# Patient Record
Sex: Female | Born: 1995 | Race: White | Hispanic: No | Smoking: Never smoker
Health system: Southern US, Community
[De-identification: ages and names within clinical notes are randomized; demographics above are authoritative.]

## PROBLEM LIST (undated history)

## (undated) ENCOUNTER — Inpatient Hospital Stay: Payer: Self-pay

## (undated) DIAGNOSIS — O139 Gestational [pregnancy-induced] hypertension without significant proteinuria, unspecified trimester: Secondary | ICD-10-CM

## (undated) DIAGNOSIS — Z789 Other specified health status: Secondary | ICD-10-CM

## (undated) HISTORY — DX: Gestational (pregnancy-induced) hypertension without significant proteinuria, unspecified trimester: O13.9

---
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
## 2017-01-05 ENCOUNTER — Other Ambulatory Visit (HOSPITAL_COMMUNITY): Payer: Self-pay | Admitting: Family Medicine

## 2017-01-05 DIAGNOSIS — Z3402 Encounter for supervision of normal first pregnancy, second trimester: Secondary | ICD-10-CM

## 2017-01-06 LAB — OB RESULTS CONSOLE HEPATITIS B SURFACE ANTIGEN: HEP B S AG: NEGATIVE

## 2017-01-08 ENCOUNTER — Ambulatory Visit
Admission: RE | Admit: 2017-01-08 | Discharge: 2017-01-08 | Disposition: A | Discharge status: Home / Self Care | Payer: Medicaid Other | Source: Ambulatory Visit | Attending: Family Medicine | Admitting: Family Medicine

## 2017-01-08 DIAGNOSIS — Z3402 Encounter for supervision of normal first pregnancy, second trimester: Secondary | ICD-10-CM | POA: Insufficient documentation

## 2017-01-08 DIAGNOSIS — Z3A18 18 weeks gestation of pregnancy: Secondary | ICD-10-CM | POA: Insufficient documentation

## 2017-01-16 ENCOUNTER — Other Ambulatory Visit: Payer: Self-pay

## 2017-01-16 ENCOUNTER — Emergency Department
Admission: EM | Admit: 2017-01-16 | Discharge: 2017-01-16 | Disposition: A | Discharge status: Home / Self Care | Payer: BLUE CROSS/BLUE SHIELD | Attending: Emergency Medicine | Admitting: Emergency Medicine

## 2017-01-16 ENCOUNTER — Encounter: Payer: Self-pay | Admitting: Emergency Medicine

## 2017-01-16 DIAGNOSIS — M79604 Pain in right leg: Secondary | ICD-10-CM | POA: Insufficient documentation

## 2017-01-16 DIAGNOSIS — R600 Localized edema: Secondary | ICD-10-CM | POA: Insufficient documentation

## 2017-01-16 DIAGNOSIS — O26892 Other specified pregnancy related conditions, second trimester: Secondary | ICD-10-CM | POA: Insufficient documentation

## 2017-01-16 DIAGNOSIS — Z3A21 21 weeks gestation of pregnancy: Secondary | ICD-10-CM | POA: Insufficient documentation

## 2017-01-16 DIAGNOSIS — L03115 Cellulitis of right lower limb: Secondary | ICD-10-CM | POA: Diagnosis not present

## 2017-01-16 MED ORDER — CEPHALEXIN 500 MG PO CAPS: 500.0000 mg | ORAL_CAPSULE | Freq: Four times a day (QID) | ORAL | 0 refills | Status: AC

## 2017-01-16 MED ORDER — CEPHALEXIN 500 MG PO CAPS
500.0000 mg | ORAL_CAPSULE | Freq: Once | ORAL | Status: AC
  Administered 2017-01-16: 500 mg via ORAL
  Filled 2017-01-16: qty 1

## 2017-01-16 NOTE — ED Triage Notes (Signed)
Surface pain R anterior shin x 1 month. Tender to touch.

## 2017-01-16 NOTE — ED Provider Notes (Signed)
[facility] Emergency Department Provider Note   ____________________________________________   First MD Initiated Contact with Patient 01/16/17 1748     (approximate)  I have reviewed the triage vital signs and the nursing notes.   HISTORY  Chief Complaint Leg Pain    HPI Jenna Marks is a 21 y.o. female patient complaining of redness and pain to the right anterior shin for 1 month. Patient denies history of trauma. Patient stated she noticed a pimple couple weeks ago and now has progressed to redness to the right leg.Patient rates pain as a 4/10. Patient described a pain as "achy". Patient denies fever but this complaint. No palliative measures for complaint. Patient is 21 weeks' gestation.   History reviewed. No pertinent past medical history.  There are no active problems to display for this patient.   History reviewed. No pertinent surgical history.  Prior to Admission medications   Medication Sig Start Date End Date Taking? Authorizing Provider  cephALEXin (KEFLEX) 500 MG capsule Take 1 capsule (500 mg total) 4 (four) times daily for 10 days by mouth. 01/16/17 01/26/17  Joni ReiningSmith, Laelynn Blizzard K, PA-C    Allergies Patient has no known allergies.  No family history on file.  Social History Social History   Tobacco Use  . Smoking status: Never Smoker  . Smokeless tobacco: Never Used  Substance Use Topics  . Alcohol use: No  . Drug use: No    Review of Systems Constitutional: No fever/chills Eyes: No visual changes. ENT: No sore throat. Cardiovascular: Denies chest pain. Respiratory: Denies shortness of breath. Gastrointestinal: No abdominal pain.  No nausea, no vomiting.  No diarrhea.  No constipation. Genitourinary: Negative for dysuria. Musculoskeletal: Negative for back pain. Skin: Negative for rash. Neurological: Negative for headaches, focal weakness or numbness.   ____________________________________________   PHYSICAL  EXAM:  VITAL SIGNS: ED Triage Vitals  Enc Vitals Group     BP 01/16/17 1707 128/75     Pulse Rate 01/16/17 1707 82     Resp 01/16/17 1707 20     Temp 01/16/17 1707 98.1 F (36.7 C)     Temp src --      SpO2 01/16/17 1707 100 %     Weight 01/16/17 1709 130 lb (59 kg)     Height 01/16/17 1709 5\' 3"  (1.6 m)     Head Circumference --      Peak Flow --      Pain Score 01/16/17 1707 4     Pain Loc --      Pain Edu? --      Excl. in GC? --     Constitutional: Alert and oriented. Well appearing and in no acute distress. Eyes: Conjunctivae are normal. PERRL. EOMI. Head: Atraumatic. Nose: No congestion/rhinnorhea. Mouth/Throat: Mucous membranes are moist.  Oropharynx non-erythematous. Neck: No stridor. Hematological/Lymphatic/Immunilogical: No cervical lymphadenopathy. Cardiovascular: Normal rate, regular rhythm. Grossly normal heart sounds.  Good peripheral circulation. Respiratory: Normal respiratory effort.  No retractions. Lungs CTAB. Gastrointestinal: Soft and nontender. No distention. No abdominal bruits. No CVA tenderness. Musculoskeletal: No lower extremity tenderness nor edema.  No joint effusions. Neurologic:  Normal speech and language. No gross focal neurologic deficits are appreciated. No gait instability. Skin:  Erythema and edema distal third right anterior leg Psychiatric: Mood and affect are normal. Speech and behavior are normal.  ____________________________________________   LABS (all labs ordered are listed, but only abnormal results are displayed)  Labs Reviewed - No data to display ____________________________________________  EKG  ____________________________________________  RADIOLOGY  No results found.  ____________________________________________   PROCEDURES  Procedure(s) performed: None  Procedures  Critical Care performed: No  ____________________________________________   INITIAL IMPRESSION / ASSESSMENT AND PLAN / ED COURSE  As  part of my medical decision making, I reviewed the following data within the electronic MEDICAL RECORD NUMBER   Cellulitis right lower leg. Patient given discharge care instructions. Patient advised at the same area for one month. Patient advised to take medication as directed. Follow with PCP.      ____________________________________________   FINAL CLINICAL IMPRESSION(S) / ED DIAGNOSES  Final diagnoses:  Cellulitis of right leg without foot     ED Discharge Orders        Ordered    cephALEXin (KEFLEX) 500 MG capsule  4 times daily     01/16/17 1803       Note:  This document was prepared using Dragon voice recognition software and may include unintentional dictation errors.    Joni ReiningSmith, Lorianna Spadaccini K, PA-C 01/16/17 1805    Jeanmarie PlantMcShane, James A, MD 01/16/17 2101

## 2017-01-16 NOTE — Discharge Instructions (Signed)
Take medication as directed and do not shave area for 2-3 weeks. Take Tylenol or ibuprofen for pain.

## 2017-01-22 ENCOUNTER — Observation Stay
Admission: EM | Admit: 2017-01-22 | Discharge: 2017-01-22 | Disposition: A | Discharge status: Home / Self Care | Payer: BLUE CROSS/BLUE SHIELD | Attending: Obstetrics and Gynecology | Admitting: Obstetrics and Gynecology

## 2017-01-22 ENCOUNTER — Encounter: Payer: Self-pay | Admitting: *Deleted

## 2017-01-22 DIAGNOSIS — R102 Pelvic and perineal pain: Secondary | ICD-10-CM | POA: Diagnosis not present

## 2017-01-22 DIAGNOSIS — O26892 Other specified pregnancy related conditions, second trimester: Secondary | ICD-10-CM | POA: Diagnosis not present

## 2017-01-22 DIAGNOSIS — Z3A25 25 weeks gestation of pregnancy: Secondary | ICD-10-CM

## 2017-01-22 DIAGNOSIS — Z79899 Other long term (current) drug therapy: Secondary | ICD-10-CM | POA: Insufficient documentation

## 2017-01-22 DIAGNOSIS — R109 Unspecified abdominal pain: Secondary | ICD-10-CM | POA: Insufficient documentation

## 2017-01-22 DIAGNOSIS — M549 Dorsalgia, unspecified: Secondary | ICD-10-CM | POA: Diagnosis present

## 2017-01-22 HISTORY — DX: Other specified health status: Z78.9

## 2017-01-22 LAB — URINALYSIS, COMPLETE (UACMP) WITH MICROSCOPIC
Bilirubin Urine: NEGATIVE
Glucose, UA: NEGATIVE mg/dL
Ketones, ur: 20 mg/dL — AB
Leukocytes, UA: NEGATIVE
Nitrite: NEGATIVE
Protein, ur: NEGATIVE mg/dL
Specific Gravity, Urine: 1.02 (ref 1.005–1.030)
pH: 5 (ref 5.0–8.0)

## 2017-01-22 LAB — CBC
HEMATOCRIT: 32.3 % — AB (ref 35.0–47.0)
HEMOGLOBIN: 11.2 g/dL — AB (ref 12.0–16.0)
MCH: 30.3 pg (ref 26.0–34.0)
MCHC: 34.6 g/dL (ref 32.0–36.0)
MCV: 87.7 fL (ref 80.0–100.0)
Platelets: 296 10*3/uL (ref 150–440)
RBC: 3.69 MIL/uL — ABNORMAL LOW (ref 3.80–5.20)
RDW: 14.2 % (ref 11.5–14.5)
WBC: 17 10*3/uL — ABNORMAL HIGH (ref 3.6–11.0)

## 2017-01-22 MED ORDER — OXYCODONE-ACETAMINOPHEN 5-325 MG PO TABS: 2.0000 | ORAL_TABLET | ORAL | Status: DC | PRN

## 2017-01-22 MED ORDER — OXYCODONE-ACETAMINOPHEN 5-325 MG PO TABS
ORAL_TABLET | ORAL | Status: AC
  Administered 2017-01-22: 2 via ORAL
  Filled 2017-01-22: qty 2

## 2017-01-22 MED ORDER — LACTATED RINGERS IV SOLN
INTRAVENOUS | Status: DC
  Administered 2017-01-22: 09:00:00 via INTRAVENOUS

## 2017-01-22 MED ORDER — OXYCODONE-ACETAMINOPHEN 5-325 MG PO TABS: 1.0000 | ORAL_TABLET | ORAL | 0 refills | Status: DC | PRN

## 2017-01-22 MED ORDER — OXYCODONE-ACETAMINOPHEN 5-325 MG PO TABS
2.0000 | ORAL_TABLET | Freq: Once | ORAL | Status: AC
  Administered 2017-01-22: 2 via ORAL

## 2017-01-22 NOTE — Discharge Summary (Addendum)
    L&D OB Triage Note  SUBJECTIVE Jenna Marks is a 21 y.o. G1P0 female at 6063w4d, EDD Estimated Date of Delivery: 05/03/17 who presented to triage with complaints of right flank pain.  She has never had pain like this before.  Denies midline pain, denies vaginal bleeding, has had some nausea and vomiting but has had the same amount throughout the rest of her pregnancy.  This is not new for her.  Tried Tylenol without success.  Obstetric History   G1   P0   T0   P0   A0   L0    SAB0   TAB0   Ectopic0   Multiple0   Live Births0     # Outcome Date GA Lbr Len/2nd Weight Sex Delivery Anes PTL Lv  1 Current               Medications Prior to Admission  Medication Sig Dispense Refill Last Dose  . Prenatal Vit-Fe Fumarate-FA (MULTIVITAMIN-PRENATAL) 27-0.8 MG TABS tablet Take 1 tablet by mouth daily at 12 noon.     . cephALEXin (KEFLEX) 500 MG capsule Take 1 capsule (500 mg total) 4 (four) times daily for 10 days by mouth. 40 capsule 0    I saw this patient and spent time with her discussing her condition and follow-up.   OBJECTIVE  Nursing Evaluation:   BP 107/63   Pulse 90   Temp 97.8 F (36.6 C) (Oral)   Resp 18    Findings: Fetal heart tones appropriate for gestational age.    UA with large blood.  NST was performed and has been reviewed by me.  NST INTERPRETATION: Fetal heart tones appropriate for gestational age  Mode: Doppler Baseline Rate (A): 138 bpm           Contraction Frequency (min): none  ASSESSMENT Impression:  1.  Pregnancy:  G1P0 at 363w4d , EDD Estimated Date of Delivery: 05/03/17 2.  NST: Appropriate for gestational age 733.  Pain has resolved with fluid hydration and p.o. pain medication.  PLAN 1. Reassurance given 2. Discharge home with standard labor precautions given to return to L&D or call the office for problems. 3.  Percocet given for pain relief 4.  Discussed fluid hydration 5.  Patient to strain urine.

## 2017-01-22 NOTE — OB Triage Note (Signed)
Patient states she has been having right sided back pain for several days that has gotten worse last night rating 10/10 aching and stabbing. Denies LOF, Vaginal bleeding or any other concerns. Denies cramping or contractions and pt has not ever felt baby move yet (pt says Dr. Catalina Pizzaold her it could be due to anterior placenta) RN unable to monitor FHT with US, FHT 145 with doppler.

## 2017-01-22 NOTE — Discharge Summary (Signed)
Patient discharged home, discharge instructions given, patient states understanding. Patient left floor in stable condition, denies any other needs at this time. Patient to keep next scheduled OB appointment 

## 2017-01-22 NOTE — Discharge Instructions (Signed)
Drink plenty of fluid and get plenty of rest. Call your provider for any other concerns. Keep your next scheduled follow up appointment

## 2017-02-06 ENCOUNTER — Emergency Department
Admission: EM | Admit: 2017-02-06 | Discharge: 2017-02-06 | Disposition: A | Discharge status: Home / Self Care | Payer: BLUE CROSS/BLUE SHIELD | Attending: Emergency Medicine | Admitting: Emergency Medicine

## 2017-02-06 ENCOUNTER — Emergency Department: Payer: BLUE CROSS/BLUE SHIELD

## 2017-02-06 ENCOUNTER — Encounter: Payer: Self-pay | Admitting: Emergency Medicine

## 2017-02-06 ENCOUNTER — Other Ambulatory Visit: Payer: Self-pay

## 2017-02-06 DIAGNOSIS — O9989 Other specified diseases and conditions complicating pregnancy, childbirth and the puerperium: Secondary | ICD-10-CM | POA: Diagnosis not present

## 2017-02-06 DIAGNOSIS — L03115 Cellulitis of right lower limb: Secondary | ICD-10-CM | POA: Diagnosis not present

## 2017-02-06 DIAGNOSIS — Z79899 Other long term (current) drug therapy: Secondary | ICD-10-CM | POA: Insufficient documentation

## 2017-02-06 DIAGNOSIS — R239 Unspecified skin changes: Secondary | ICD-10-CM | POA: Diagnosis present

## 2017-02-06 DIAGNOSIS — Z3A22 22 weeks gestation of pregnancy: Secondary | ICD-10-CM | POA: Diagnosis not present

## 2017-02-06 DIAGNOSIS — Z3402 Encounter for supervision of normal first pregnancy, second trimester: Secondary | ICD-10-CM | POA: Insufficient documentation

## 2017-02-06 LAB — COMPREHENSIVE METABOLIC PANEL
ALT: 15 U/L (ref 14–54)
AST: 17 U/L (ref 15–41)
Albumin: 3.5 g/dL (ref 3.5–5.0)
Alkaline Phosphatase: 85 U/L (ref 38–126)
Anion gap: 10 (ref 5–15)
BUN: 8 mg/dL (ref 6–20)
CO2: 22 mmol/L (ref 22–32)
Calcium: 9 mg/dL (ref 8.9–10.3)
Chloride: 106 mmol/L (ref 101–111)
Creatinine, Ser: 0.4 mg/dL — ABNORMAL LOW (ref 0.44–1.00)
GFR calc Af Amer: >60 mL/min (ref >60)
GFR calc non Af Amer: >60 mL/min (ref >60)
Glucose, Bld: 83 mg/dL (ref 65–99)
Potassium: 3.7 mmol/L (ref 3.5–5.1)
Sodium: 138 mmol/L (ref 135–145)
Total Bilirubin: <0.1 mg/dL — ABNORMAL LOW (ref 0.3–1.2)
Total Protein: 7.2 g/dL (ref 6.5–8.1)

## 2017-02-06 LAB — CBC WITH DIFFERENTIAL/PLATELET
Basophils Absolute: 0 10*3/uL (ref 0–0.1)
Basophils Relative: 0 %
Eosinophils Absolute: 0.4 10*3/uL (ref 0–0.7)
Eosinophils Relative: 4 %
HCT: 30.7 % — ABNORMAL LOW (ref 35.0–47.0)
Hemoglobin: 10.7 g/dL — ABNORMAL LOW (ref 12.0–16.0)
Lymphocytes Relative: 13 %
Lymphs Abs: 1.5 10*3/uL (ref 1.0–3.6)
MCH: 30.5 pg (ref 26.0–34.0)
MCHC: 34.8 g/dL (ref 32.0–36.0)
MCV: 87.6 fL (ref 80.0–100.0)
Monocytes Absolute: 0.8 10*3/uL (ref 0.2–0.9)
Monocytes Relative: 7 %
Neutro Abs: 9 10*3/uL — ABNORMAL HIGH (ref 1.4–6.5)
Neutrophils Relative %: 76 %
Platelets: 311 10*3/uL (ref 150–440)
RBC: 3.5 MIL/uL — ABNORMAL LOW (ref 3.80–5.20)
RDW: 13.7 % (ref 11.5–14.5)
WBC: 11.7 10*3/uL — ABNORMAL HIGH (ref 3.6–11.0)

## 2017-02-06 MED ORDER — CLINDAMYCIN HCL 300 MG PO CAPS: 300.0000 mg | ORAL_CAPSULE | Freq: Three times a day (TID) | ORAL | 0 refills | Status: AC

## 2017-02-06 NOTE — ED Triage Notes (Signed)
Seen through ED a few weeks ago for a bump on the shin.  Patient states she took all prescribed medications and symptoms have not resolved and area more swollen.  Patient is [redacted] weeks pregnant.

## 2017-02-06 NOTE — ED Provider Notes (Signed)
[facility] Emergency Department Provider Note  ____________________________________________  Time seen: Approximately 3:44 PM  I have reviewed the triage vital signs and the nursing notes.   HISTORY  Chief Complaint Abscess    HPI Jenna Marks is a 21 y.o. female presents to the emergency department with a 3 cm x 3 cm region of dusky erythema along the right lower shank.  Patient reports that her symptoms first started approximately 1 month ago.  Patient did recall an area of folliculitis that apparently resolved without complication.  Patient reports that she was seen in the emergency department and discharged with Keflex.  Patient reports that her symptoms did not improve at all and seemed to be worsening.  She denies recent travel, prolonged immobilization, recent surgery or daily smoking.  Patient is currently approximately [redacted] weeks pregnant.  She denies abdominal pain, changes in vaginal discharge, gush of vaginal fluids or cramping.  Patient reports that she has been taking Percocet for kidney stone pain.     Past Medical History:  Diagnosis Date  . Medical history non-contributory     Patient Active Problem List   Diagnosis Date Noted  . Back pain 01/22/2017    History reviewed. No pertinent surgical history.  Prior to Admission medications   Medication Sig Start Date End Date Taking? Authorizing Provider  clindamycin (CLEOCIN) 300 MG capsule Take 1 capsule (300 mg total) by mouth 3 (three) times daily for 10 days. 02/06/17 02/16/17  Orvil FeilWoods, Sydny Schnitzler M, PA-C  oxyCODONE-acetaminophen (PERCOCET/ROXICET) 5-325 MG tablet Take 1-2 tablets by mouth every 4 (four) hours as needed for moderate pain or severe pain. 01/22/17   Linzie CollinEvans, David James, MD  Prenatal Vit-Fe Fumarate-FA (MULTIVITAMIN-PRENATAL) 27-0.8 MG TABS tablet Take 1 tablet by mouth daily at 12 noon.    [provider]    Allergies Patient has no known allergies.  No family history  on file.  Social History Social History   Tobacco Use  . Smoking status: Never Smoker  . Smokeless tobacco: Never Used  Substance Use Topics  . Alcohol use: No  . Drug use: No     Review of Systems  Constitutional: No fever/chills Eyes: No visual changes. No discharge ENT: No upper respiratory complaints. Cardiovascular: no chest pain. Respiratory: no cough. No SOB. Musculoskeletal: Negative for musculoskeletal pain. Skin: Patient has erythema of right lower leg. Neurological: Negative for headaches, focal weakness or numbness.   ____________________________________________   PHYSICAL EXAM:  VITAL SIGNS: ED Triage Vitals  Enc Vitals Group     BP 02/06/17 1428 125/73     Pulse Rate 02/06/17 1428 91     Resp 02/06/17 1428 16     Temp 02/06/17 1428 98.1 F (36.7 C)     Temp Source 02/06/17 1428 Oral     SpO2 02/06/17 1428 100 %     Weight 02/06/17 1427 130 lb (59 kg)     Height 02/06/17 1427 5\' 3"  (1.6 m)     Head Circumference --      Peak Flow --      Pain Score 02/06/17 1427 0     Pain Loc --      Pain Edu? --      Excl. in GC? --      Constitutional: Alert and oriented. Well appearing and in no acute distress. Eyes: Conjunctivae are normal. PERRL. EOMI. Head: Atraumatic. Cardiovascular: Normal rate, regular rhythm. Normal S1 and S2.  Good peripheral circulation. Respiratory: Normal respiratory effort without tachypnea or  retractions. Lungs CTAB. Good air entry to the bases with no decreased or absent breath sounds. Musculoskeletal: Full range of motion to all extremities. No gross deformities appreciated. Neurologic:  Normal speech and language. No gross focal neurologic deficits are appreciated.  Skin: Dusky erythema along the right anterior lower leg with palpable dorsalis pedis pulse bilaterally and symmetrically.  Skin involving erythema is edematous and has pitting edema, 1+. ____________________________________________   LABS (all labs ordered are  listed, but only abnormal results are displayed)  Labs Reviewed  CBC WITH DIFFERENTIAL/PLATELET - Abnormal; Notable for the following components:      Result Value   WBC 11.7 (*)    RBC 3.50 (*)    Hemoglobin 10.7 (*)    HCT 30.7 (*)    Neutro Abs 9.0 (*)    All other components within normal limits  COMPREHENSIVE METABOLIC PANEL - Abnormal; Notable for the following components:   Creatinine, Ser 0.40 (*)    Total Bilirubin <0.1 (*)    All other components within normal limits   ____________________________________________  EKG   ____________________________________________  RADIOLOGY Geraldo Pitter, personally viewed and evaluated these images as part of my medical decision making, as well as reviewing the written report by the radiologist.  Looks shorter I&O and his weight is a little bit longer it is easier to pin up and do different things so  US Venous Img Lower Unilateral Right  Result Date: 02/06/2017 CLINICAL DATA:  RIGHT lower extremity pitting edema and erythema EXAM: Bright LOWER EXTREMITY VENOUS DOPPLER ULTRASOUND TECHNIQUE: Gray-scale sonography with graded compression, as well as color Doppler and duplex ultrasound were performed to evaluate the lower extremity deep venous systems from the level of the common femoral vein and including the common femoral, femoral, profunda femoral, popliteal and calf veins including the posterior tibial, peroneal and gastrocnemius veins when visible. The superficial great saphenous vein was also interrogated. Spectral Doppler was utilized to evaluate flow at rest and with distal augmentation maneuvers in the common femoral, femoral and popliteal veins. COMPARISON:  None FINDINGS: Contralateral Common Femoral Vein: Respiratory phasicity is normal and symmetric with the symptomatic side. No evidence of thrombus. Normal compressibility. Common Femoral Vein: No evidence of thrombus. Normal compressibility, respiratory phasicity and response  to augmentation. Saphenofemoral Junction: No evidence of thrombus. Normal compressibility and flow on color Doppler imaging. Profunda Femoral Vein: No evidence of thrombus. Normal compressibility and flow on color Doppler imaging. Femoral Vein: No evidence of thrombus. Normal compressibility, respiratory phasicity and response to augmentation. Popliteal Vein: No evidence of thrombus. Normal compressibility, respiratory phasicity and response to augmentation. Calf Veins: No evidence of thrombus. Normal compressibility and flow on color Doppler imaging. Superficial Great Saphenous Vein: No evidence of thrombus. Normal compressibility. Venous Reflux:  None. Other Findings:  None. IMPRESSION: No evidence of deep venous thrombosis in the RIGHT lower extremity. Electronically Signed   By: Ulyses Southward M.D.   On: 02/06/2017 16:19    ____________________________________________    PROCEDURES  Procedure(s) performed:    Procedures    Medications - No data to display   ____________________________________________   INITIAL IMPRESSION / ASSESSMENT AND PLAN / ED COURSE  Pertinent labs & imaging results that were available during my care of the patient were reviewed by me and considered in my medical decision making (see chart for details).  Review of the [place] CSRS was performed in accordance of the [facility] prior to dispensing any controlled drugs.     Assessment and Plan:  Cellulitis:  Patient presents to the emergency department with dusky erythema of the right lower shank.  On physical exam, patient also had 1+ pitting edema but no regions of palpable fluctuance or induration.  Incision and drainage is not warranted at this time.  Differential diagnosis originally included cellulitis versus DVT.  Ultrasound conducted in the emergency department revealed no evidence of thromboembolism.  I suspect the patient did not have adequate MRSA coverage with Keflex.  Patient was discharged with Clindamycin and  advised to return to the emergency department for new or worsening symptoms.  ____________________________________________  FINAL CLINICAL IMPRESSION(S) / ED DIAGNOSES  Final diagnoses:  Cellulitis of right lower extremity      NEW MEDICATIONS STARTED DURING THIS VISIT:  ED Discharge Orders        Ordered    clindamycin (CLEOCIN) 300 MG capsule  3 times daily     02/06/17 1707          This chart was dictated using voice recognition software/Dragon. Despite best efforts to proofread, errors can occur which can change the meaning. Any change was purely unintentional.    Orvil FeilWoods, Anwar Sakata M, PA-C 02/06/17 1724    Phineas SemenGoodman, Graydon, MD 02/11/17 [phone]

## 2017-03-02 NOTE — L&D Delivery Note (Signed)
Obstetrical Delivery Note   Date of Delivery:   05/30/2017 Primary OB:   [facility] Gestational Age/EDD: 6959w2d (Dated by 18 week US) Antepartum complications: anemia and late entry to care  Delivered By:   Farrel Connersolleen Ronit Marczak, CNM  Delivery Type:   spontaneous vaginal delivery  Procedure Details:   Mother pushed to deliver a viable female infant in OA with nuchal cord x 1 which was reduced on the perineum. Good cry after tactile stimulation. Baby placed on mother's abdomen and dried. After delayed cord clamping, the cord was cut by the FOB. Baby then placed on mother's chest. Spontaneous delivery of intact placenta and 3 vessel cord followed by uterine atony. Clots evacuated from LUS and vagina.  800 mcg of Cytotec PR and IV Pitocin as well as bimanual massage resolved uterine atony. Repaired superficial vaginal laceration extending to introitus under local anesthesia with 1% Lidocine with 3-0 Chromic. EBL 450 ml Anesthesia:    local Intrapartum complications: protracted active labor, elevated blood pressures in active labor, uterine atony GBS:    negative Laceration:    1st degree and vaginal Episiotomy:    none Placenta:    Via active 3rd stage. To pathology: no Estimated Blood Loss:  450 ml Baby:    Liveborn female, Apgars 8/9, weight 7#4oz    Farrel Connersolleen Delynn Olvera, CNM

## 2017-03-09 ENCOUNTER — Ambulatory Visit (INDEPENDENT_AMBULATORY_CARE_PROVIDER_SITE_OTHER): Payer: BLUE CROSS/BLUE SHIELD | Admitting: Maternal Newborn

## 2017-03-09 ENCOUNTER — Encounter: Payer: Self-pay | Admitting: Maternal Newborn

## 2017-03-09 VITALS — BP 110/60 | Wt 152.0 lb

## 2017-03-09 DIAGNOSIS — O99012 Anemia complicating pregnancy, second trimester: Secondary | ICD-10-CM

## 2017-03-09 DIAGNOSIS — Z3400 Encounter for supervision of normal first pregnancy, unspecified trimester: Secondary | ICD-10-CM

## 2017-03-09 NOTE — Progress Notes (Signed)
No concerns.rj  Tx of care from [facility]. rj

## 2017-03-09 NOTE — Progress Notes (Signed)
03/09/2017   Chief Complaint: Desires prenatal care.  Transfer of Care Patient: yes, from [facility]  History of Present Illness: [name] is a 22 y.o. G1P0 at 62104w1d based on an early ultrasound (done in [place], no copy on record) with an Estimated Date of Delivery: 05/03/17, with the above CC.   Her periods were: regular periods every 28 days She was using condoms when she conceived.  She has Positive signs or symptoms of nausea/vomiting of pregnancy (first trimester, has resolved). She has Negative signs or symptoms of miscarriage or preterm labor She identifies Negative Zika risk factors for her and her partner On any different medications around the time she conceived/early pregnancy: No. Had an episode of kidney stones in November and took Percocet until she passed the stones. Has not had problems since. Taking iron for anemia.  History of varicella: Yes   ROS: A 12-point review of systems was performed and negative, except as stated in the above HPI.  OBGYN History: As per HPI. OB History  Gravida Para Term Preterm AB Living  1            SAB TAB Ectopic Multiple Live Births               # Outcome Date GA Lbr Len/2nd Weight Sex Delivery Anes PTL Lv  1 Current               Any issues with any prior pregnancies: not applicable Any prior children are healthy, doing well, without any problems or issues: not applicable History of pap smears: Not indicated due to age, was done at first prenatal in [place] but no copy of record. History of STIs: No   Past Medical History: Past Medical History:  Diagnosis Date  . Medical history non-contributory     Past Surgical History: No past surgical history on file.  Family History:  No family history on file. She denies any female cancers, bleeding or blood clotting disorders.  She denies any history of intellectual disability, birth defects or genetic disorders in her or the FOB's history.  Social History:  Social History    Socioeconomic History  . Marital status: Single    Spouse name: Not on file  . Number of children: Not on file  . Years of education: Not on file  . Highest education level: Not on file  Social Needs  . Financial resource strain: Not on file  . Food insecurity - worry: Not on file  . Food insecurity - inability: Not on file  . Transportation needs - medical: Not on file  . Transportation needs - non-medical: Not on file  Occupational History  . Not on file  Tobacco Use  . Smoking status: Never Smoker  . Smokeless tobacco: Never Used  Substance and Sexual Activity  . Alcohol use: No  . Drug use: No  . Sexual activity: Not on file  Other Topics Concern  . Not on file  Social History Narrative  . Not on file   Any cats in the household: yes, does not handle feces/littter Denies history of and current domestic violence.  Allergy: No Known Allergies  Current Outpatient Medications:  Current Outpatient Medications:  .  oxyCODONE-acetaminophen (PERCOCET/ROXICET) 5-325 MG tablet, Take 1-2 tablets by mouth every 4 (four) hours as needed for moderate pain or severe pain., Disp: 20 tablet, Rfl: 0 .  Prenatal Vit-Fe Fumarate-FA (MULTIVITAMIN-PRENATAL) 27-0.8 MG TABS tablet, Take 1 tablet by mouth daily at 12 noon., Disp: , Rfl:  Physical Exam:   BP 110/60   Wt 152 lb (68.9 kg)   LMP 09/16/2016 (Within Days)   BMI 26.93 kg/m  Body mass index is 26.93 kg/m. Constitutional: Well nourished, well developed female in no acute distress.  Neck:  Supple, normal appearance, and no thyromegaly  Cardiovascular: S1, S2 normal, no murmur, rub or gallop, regular rate and rhythm Respiratory:  Clear to auscultation bilateral. Normal respiratory effort Abdomen: positive bowel sounds and no masses, hernias; diffusely non tender to palpation, non distended Breasts: breasts appear normal, no suspicious masses, no skin or nipple changes or axillary nodes, risk and benefit of breast self-exam  was discussed. Neuro/Psych:  Normal mood and affect.  Skin:  Warm and dry.  Lymphatic:  No inguinal lymphadenopathy.   Pelvic exam: is not limited by body habitus External genitalia, Bartholin's glands, Urethra, Skene's glands: within normal limits Vagina: within normal limits and with no blood in the vault  Cervix: normal appearing cervix without discharge or lesions, closed/long/high Uterus:  enlarged, c/w 26 week size Adnexa:  normal adnexa  Assessment: Ms. Frohlich is a 22 y.o. G1P0 at 24w 6d based on Patient's last menstrual period on 09/16/2016 (within days), with an Estimated Date of Delivery: 05/03/17 presenting for prenatal care.  Plan:  1) Avoid alcoholic beverages. 2) Patient encouraged not to smoke.  3) Discontinue the use of all non-medicinal drugs and chemicals.  4) Take prenatal vitamins daily.  5) Seatbelt use advised. 6) Nutrition, food safety (fish, cheese advisories, and high nitrite foods) and exercise discussed. 7) Hospital and practice style delivering at [facility] discussed.  8) Patient is asked about travel to areas at risk for the Zika virus, and counseled to avoid travel and exposure to mosquitoes or sexual partners who may have themselves been exposed to the virus. Testing is discussed, and will be ordered as appropriate.  9) Childbirth classes at [facility] advised. 10) Genetic Screening, such as with 1st Trimester Screening, cell free fetal DNA, AFP testing, and Ultrasound, as well as with amniocentesis and CVS as appropriate, is discussed with patient. She has had genetic testing this pregnancy (Quad screen) and it returned negative. 11) Dating: Episode created with due date of 05/03/17 based on early US done in [place] which is not available for review. LMP dating supports EDD of 06/23/2017 and anatomy ultrasound was more consistent with April dating. Patient's measurements consistent with later dating as well. Will revert to LMP dating as used by [facility] for prior prenatal  care. 12) Weight gain appropriate for pregnancy, fundal height small for date of 05/03/17 and more appropriate for April dating. Will follow up with growth scan next week to correlate to later dating. 13) Return next week for GTT/labs.  Problem list reviewed and updated.  Return in about 1 week (around 03/16/2017) for ROB with GTT/labs/growth [place].  Marcelyn Bruins, CNM [facility], [facility] 03/09/2017  4:43 PM

## 2017-03-09 NOTE — Patient Instructions (Signed)
Third Trimester of Pregnancy The third trimester is from week 28 through week 40 (months 7 through 9). The third trimester is a time when the unborn baby (fetus) is growing rapidly. At the end of the ninth month, the fetus is about 20 inches in length and weighs 6-10 pounds. Body changes during your third trimester Your body will continue to go through many changes during pregnancy. The changes vary from woman to woman. During the third trimester:  Your weight will continue to increase. You can expect to gain 25-35 pounds (11-16 kg) by the end of the pregnancy.  You may begin to get stretch marks on your hips, abdomen, and breasts.  You may urinate more often because the fetus is moving lower into your pelvis and pressing on your bladder.  You may develop or continue to have heartburn. This is caused by increased hormones that slow down muscles in the digestive tract.  You may develop or continue to have constipation because increased hormones slow digestion and cause the muscles that push waste through your intestines to relax.  You may develop hemorrhoids. These are swollen veins (varicose veins) in the rectum that can itch or be painful.  You may develop swollen, bulging veins (varicose veins) in your legs.  You may have increased body aches in the pelvis, back, or thighs. This is due to weight gain and increased hormones that are relaxing your joints.  You may have changes in your hair. These can include thickening of your hair, rapid growth, and changes in texture. Some women also have hair loss during or after pregnancy, or hair that feels dry or thin. Your hair will most likely return to normal after your baby is born.  Your breasts will continue to grow and they will continue to become tender. A yellow fluid (colostrum) may leak from your breasts. This is the first milk you are producing for your baby.  Your belly button may stick out.  You may notice more swelling in your hands,  face, or ankles.  You may have increased tingling or numbness in your hands, arms, and legs. The skin on your belly may also feel numb.  You may feel short of breath because of your expanding uterus.  You may have more problems sleeping. This can be caused by the size of your belly, increased need to urinate, and an increase in your body's metabolism.  You may notice the fetus "dropping," or moving lower in your abdomen (lightening).  You may have increased vaginal discharge.  You may notice your joints feel loose and you may have pain around your pelvic bone.  What to expect at prenatal visits You will have prenatal exams every 2 weeks until week 36. Then you will have weekly prenatal exams. During a routine prenatal visit:  You will be weighed to make sure you and the baby are growing normally.  Your blood pressure will be taken.  Your abdomen will be measured to track your baby's growth.  The fetal heartbeat will be listened to.  Any test results from the previous visit will be discussed.  You may have a cervical check near your due date to see if your cervix has softened or thinned (effaced).  You will be tested for Group B streptococcus. This happens between 35 and 37 weeks.  Your health care provider may ask you:  What your birth plan is.  How you are feeling.  If you are feeling the baby move.  If you have had   any abnormal symptoms, such as leaking fluid, bleeding, severe headaches, or abdominal cramping.  If you are using any tobacco products, including cigarettes, chewing tobacco, and electronic cigarettes.  If you have any questions.  Other tests or screenings that may be performed during your third trimester include:  Blood tests that check for low iron levels (anemia).  Fetal testing to check the health, activity level, and growth of the fetus. Testing is done if you have certain medical conditions or if there are problems during the  pregnancy.  Nonstress test (NST). This test checks the health of your baby to make sure there are no signs of problems, such as the baby not getting enough oxygen. During this test, a belt is placed around your belly. The baby is made to move, and its heart rate is monitored during movement.  What is false labor? False labor is a condition in which you feel small, irregular tightenings of the muscles in the womb (contractions) that usually go away with rest, changing position, or drinking water. These are called Braxton Hicks contractions. Contractions may last for hours, days, or even weeks before true labor sets in. If contractions come at regular intervals, become more frequent, increase in intensity, or become painful, you should see your health care provider. What are the signs of labor?  Abdominal cramps.  Regular contractions that start at 10 minutes apart and become stronger and more frequent with time.  Contractions that start on the top of the uterus and spread down to the lower abdomen and back.  Increased pelvic pressure and dull back pain.  A watery or bloody mucus discharge that comes from the vagina.  Leaking of amniotic fluid. This is also known as your "water breaking." It could be a slow trickle or a gush. Let your health care provider know if it has a color or strange odor. If you have any of these signs, call your health care provider right away, even if it is before your due date. Follow these instructions at home: Medicines  Follow your health care provider's instructions regarding medicine use. Specific medicines may be either safe or unsafe to take during pregnancy.  Take a prenatal vitamin that contains at least 600 micrograms (mcg) of folic acid.  If you develop constipation, try taking a stool softener if your health care provider approves. Eating and drinking  Eat a balanced diet that includes fresh fruits and vegetables, whole grains, good sources of protein  such as meat, eggs, or tofu, and low-fat dairy. Your health care provider will help you determine the amount of weight gain that is right for you.  Avoid raw meat and uncooked cheese. These carry germs that can cause birth defects in the baby.  If you have low calcium intake from food, talk to your health care provider about whether you should take a daily calcium supplement.  Eat four or five small meals rather than three large meals a day.  Limit foods that are high in fat and processed sugars, such as fried and sweet foods.  To prevent constipation: ? Drink enough fluid to keep your urine clear or pale yellow. ? Eat foods that are high in fiber, such as fresh fruits and vegetables, whole grains, and beans. Activity  Exercise only as directed by your health care provider. Most women can continue their usual exercise routine during pregnancy. Try to exercise for 30 minutes at least 5 days a week. Stop exercising if you experience uterine contractions.  Avoid heavy   lifting.  Do not exercise in extreme heat or humidity, or at high altitudes.  Wear low-heel, comfortable shoes.  Practice good posture.  You may continue to have sex unless your health care provider tells you otherwise. Relieving pain and discomfort  Take frequent breaks and rest with your legs elevated if you have leg cramps or low back pain.  Take warm sitz baths to soothe any pain or discomfort caused by hemorrhoids. Use hemorrhoid cream if your health care provider approves.  Wear a good support bra to prevent discomfort from breast tenderness.  If you develop varicose veins: ? Wear support pantyhose or compression stockings as told by your healthcare provider. ? Elevate your feet for 15 minutes, 3-4 times a day. Prenatal care  Write down your questions. Take them to your prenatal visits.  Keep all your prenatal visits as told by your health care provider. This is important. Safety  Wear your seat belt at  all times when driving.  Make a list of emergency phone numbers, including numbers for family, friends, the hospital, and police and fire departments. General instructions  Avoid cat litter boxes and soil used by cats. These carry germs that can cause birth defects in the baby. If you have a cat, ask someone to clean the litter box for you.  Do not travel far distances unless it is absolutely necessary and only with the approval of your health care provider.  Do not use hot tubs, steam rooms, or saunas.  Do not drink alcohol.  Do not use any products that contain nicotine or tobacco, such as cigarettes and e-cigarettes. If you need help quitting, ask your health care provider.  Do not use any medicinal herbs or unprescribed drugs. These chemicals affect the formation and growth of the baby.  Do not douche or use tampons or scented sanitary pads.  Do not cross your legs for long periods of time.  To prepare for the arrival of your baby: ? Take prenatal classes to understand, practice, and ask questions about labor and delivery. ? Make a trial run to the hospital. ? Visit the hospital and tour the maternity area. ? Arrange for maternity or paternity leave through employers. ? Arrange for family and friends to take care of pets while you are in the hospital. ? Purchase a rear-facing car seat and make sure you know how to install it in your car. ? Pack your hospital bag. ? Prepare the baby's nursery. Make sure to remove all pillows and stuffed animals from the baby's crib to prevent suffocation.  Visit your dentist if you have not gone during your pregnancy. Use a soft toothbrush to brush your teeth and be gentle when you floss. Contact a health care provider if:  You are unsure if you are in labor or if your water has broken.  You become dizzy.  You have mild pelvic cramps, pelvic pressure, or nagging pain in your abdominal area.  You have lower back pain.  You have persistent  nausea, vomiting, or diarrhea.  You have an unusual or bad smelling vaginal discharge.  You have pain when you urinate. Get help right away if:  Your water breaks before 37 weeks.  You have regular contractions less than 5 minutes apart before 37 weeks.  You have a fever.  You are leaking fluid from your vagina.  You have spotting or bleeding from your vagina.  You have severe abdominal pain or cramping.  You have rapid weight loss or weight gain.    You have shortness of breath with chest pain.  You notice sudden or extreme swelling of your face, hands, ankles, feet, or legs.  Your baby makes fewer than 10 movements in 2 hours.  You have severe headaches that do not go away when you take medicine.  You have vision changes. Summary  The third trimester is from week 28 through week 40, months 7 through 9. The third trimester is a time when the unborn baby (fetus) is growing rapidly.  During the third trimester, your discomfort may increase as you and your baby continue to gain weight. You may have abdominal, leg, and back pain, sleeping problems, and an increased need to urinate.  During the third trimester your breasts will keep growing and they will continue to become tender. A yellow fluid (colostrum) may leak from your breasts. This is the first milk you are producing for your baby.  False labor is a condition in which you feel small, irregular tightenings of the muscles in the womb (contractions) that eventually go away. These are called Braxton Hicks contractions. Contractions may last for hours, days, or even weeks before true labor sets in.  Signs of labor can include: abdominal cramps; regular contractions that start at 10 minutes apart and become stronger and more frequent with time; watery or bloody mucus discharge that comes from the vagina; increased pelvic pressure and dull back pain; and leaking of amniotic fluid. This information is not intended to replace advice  given to you by your health care provider. Make sure you discuss any questions you have with your health care provider. Document Released: 02/10/2001 Document Revised: 07/25/2015 Document Reviewed: 04/19/2012 Elsevier Interactive Patient Education  2017 Elsevier Inc.  

## 2017-03-17 ENCOUNTER — Encounter: Payer: Self-pay | Admitting: Obstetrics and Gynecology

## 2017-03-17 ENCOUNTER — Ambulatory Visit (INDEPENDENT_AMBULATORY_CARE_PROVIDER_SITE_OTHER): Payer: BLUE CROSS/BLUE SHIELD

## 2017-03-17 ENCOUNTER — Ambulatory Visit (INDEPENDENT_AMBULATORY_CARE_PROVIDER_SITE_OTHER): Payer: BLUE CROSS/BLUE SHIELD | Admitting: Obstetrics and Gynecology

## 2017-03-17 ENCOUNTER — Ambulatory Visit: Payer: BLUE CROSS/BLUE SHIELD

## 2017-03-17 VITALS — BP 114/70 | Wt 151.0 lb

## 2017-03-17 DIAGNOSIS — Z3A27 27 weeks gestation of pregnancy: Secondary | ICD-10-CM

## 2017-03-17 DIAGNOSIS — Z3400 Encounter for supervision of normal first pregnancy, unspecified trimester: Secondary | ICD-10-CM | POA: Diagnosis not present

## 2017-03-17 DIAGNOSIS — O99012 Anemia complicating pregnancy, second trimester: Secondary | ICD-10-CM

## 2017-03-17 DIAGNOSIS — Z362 Encounter for other antenatal screening follow-up: Secondary | ICD-10-CM | POA: Diagnosis not present

## 2017-03-17 NOTE — Progress Notes (Signed)
  Routine Prenatal Care Visit  Subjective  Jenna Marks is a 22 y.o. G1P0 at 346w5d being seen today for ongoing prenatal care.  She is currenCorie Chiquitotly monitored for the following issues for this low-risk pregnancy and has Back pain; Supervision of normal first pregnancy, antepartum; and Anemia complicating pregnancy in second trimester on their problem list.  ----------------------------------------------------------------------------------- Patient reports no complaints.   Contractions: Not present. Vag. Bleeding: None.  Movement: Present. Denies leaking of fluid.  U/s today shows completed anatomy screen.  ----------------------------------------------------------------------------------- The following portions of the patient's history were reviewed and updated as appropriate: allergies, current medications, past family history, past medical history, past social history, past surgical history and problem list. Problem list updated.   Objective  Blood pressure 114/70, weight 151 lb (68.5 kg), last menstrual period 09/16/2016. Pregravid weight 130 lb (59 kg) Total Weight Gain 21 lb (9.526 kg) Urinalysis: Urine Protein: Negative Urine Glucose: Negative  Fetal Status: Fetal Heart Rate (bpm): Present   Movement: Present     General:  Alert, oriented and cooperative. Patient is in no acute distress.  Skin: Skin is warm and dry. No rash noted.   Cardiovascular: Normal heart rate noted  Respiratory: Normal respiratory effort, no problems with respiration noted  Abdomen: Soft, gravid, appropriate for gestational age. Pain/Pressure: Absent     Pelvic:  Cervical exam deferred        Extremities: Normal range of motion.     Mental Status: Normal mood and affect. Normal behavior. Normal judgment and thought content.   Assessment   22 y.o. G1P0 at 556w5d by  06/11/2017, by Ultrasound presenting for routine prenatal visit  Plan   G1 Problems (from 01/22/17 to present)    Problem Noted Resolved     Supervision of normal first pregnancy, antepartum 03/09/2017 by Oswaldo ConroySchmid, Jacelyn Y, CNM No   Overview Addendum 03/09/2017  4:31 PM by Oswaldo ConroySchmid, Jacelyn Y, CNM    Clinic [facility]atal [facility]  Dating LMP Blood type: O Positive  Genetic Screen Quad: Negative (done at [facility]) Antibody: Negative  Anatomic US Complete Rubella:   Varicella: Non-immune  GTT Third trimester:  RPR: Non-reactive  Rhogam  HBsAg: Negative  TDaP vaccine                   Flu Shot: 01/04/17 HIV:Non-reactive   Baby Food Breast                              GBS:   Contraception Undecided Pap: Not indicated due to age  CBB     CS/VBAC    Support Person FOB - [place]             Anemia complicating pregnancy in second trimester 03/09/2017 by Oswaldo ConroySchmid, Jacelyn Y, CNM No      Preterm labor symptoms and general obstetric precautions including but not limited to vaginal bleeding, contractions, leaking of fluid and fetal movement were reviewed in detail with the patient. Please refer to After Visit Summary for other counseling recommendations.   Return in about 2 weeks (around 03/31/2017) for Routine Prenatal Appointment.  Assigned EDD based on 18 week [facility]. Patient to bring report from [place] for review.  Thomasene MohairStephen Latasha Puskas, MD  03/17/2017 2:59 PM

## 2017-03-17 NOTE — Progress Notes (Signed)
No vb. No lof. U/s today.1 hr gtt

## 2017-03-26 ENCOUNTER — Other Ambulatory Visit: Payer: BLUE CROSS/BLUE SHIELD

## 2017-03-27 LAB — 28 WEEK RH+PANEL
BASOS: 0 %
Basophils Absolute: 0 10*3/uL (ref 0.0–0.2)
EOS (ABSOLUTE): 0.4 10*3/uL (ref 0.0–0.4)
EOS: 4 %
Gestational Diabetes Screen: 100 mg/dL (ref 65–139)
HEMATOCRIT: 32.7 % — AB (ref 34.0–46.6)
HEMOGLOBIN: 11.1 g/dL (ref 11.1–15.9)
HIV Screen 4th Generation wRfx: NONREACTIVE
Immature Grans (Abs): 0.1 10*3/uL (ref 0.0–0.1)
Immature Granulocytes: 1 %
LYMPHS ABS: 1.3 10*3/uL (ref 0.7–3.1)
Lymphs: 12 %
MCH: 30.2 pg (ref 26.6–33.0)
MCHC: 33.9 g/dL (ref 31.5–35.7)
MCV: 89 fL (ref 79–97)
MONOS ABS: 0.5 10*3/uL (ref 0.1–0.9)
Monocytes: 5 %
NEUTROS PCT: 78 %
Neutrophils Absolute: 8.2 10*3/uL — ABNORMAL HIGH (ref 1.4–7.0)
Platelets: 221 10*3/uL (ref 150–379)
RBC: 3.67 x10E6/uL — AB (ref 3.77–5.28)
RDW: 13.7 % (ref 12.3–15.4)
RPR Ser Ql: NONREACTIVE
WBC: 10.5 10*3/uL (ref 3.4–10.8)

## 2017-03-27 LAB — RUBELLA SCREEN: Rubella Antibodies, IGG: 1.1 index (ref >0.99)

## 2017-03-30 ENCOUNTER — Other Ambulatory Visit: Payer: Self-pay | Admitting: Maternal Newborn

## 2017-03-30 DIAGNOSIS — Z3400 Encounter for supervision of normal first pregnancy, unspecified trimester: Secondary | ICD-10-CM

## 2017-03-31 ENCOUNTER — Ambulatory Visit (INDEPENDENT_AMBULATORY_CARE_PROVIDER_SITE_OTHER): Payer: BLUE CROSS/BLUE SHIELD | Admitting: Obstetrics & Gynecology

## 2017-03-31 VITALS — BP 120/80 | Wt 155.0 lb

## 2017-03-31 DIAGNOSIS — Z3400 Encounter for supervision of normal first pregnancy, unspecified trimester: Secondary | ICD-10-CM | POA: Diagnosis not present

## 2017-03-31 DIAGNOSIS — Z3A29 29 weeks gestation of pregnancy: Secondary | ICD-10-CM

## 2017-03-31 MED ORDER — TETANUS-DIPHTH-ACELL PERTUSSIS 5-2.5-18.5 LF-MCG/0.5 IM SUSP
0.5000 mL | Freq: Once | INTRAMUSCULAR | Status: AC
  Administered 2017-03-31: 0.5 mL via INTRAMUSCULAR

## 2017-03-31 NOTE — Progress Notes (Signed)
  Subjective  Fetal Movement? yes Contractions? no Leaking Fluid? no Vaginal Bleeding? no  Objective  BP 120/80   Wt 155 lb (70.3 kg)   LMP 09/16/2016 (Within Days)   BMI 27.46 kg/m  General: NAD Pumonary: no increased work of breathing Abdomen: gravid, non-tender Extremities: no edema Psychiatric: mood appropriate, affect full  Assessment  22 y.o. G1P0 at 5256w5d by  06/11/2017, by Ultrasound presenting for routine prenatal visit  Plan   Problem List Items Addressed This Visit    Other   Supervision of normal first pregnancy, antepartum - Primary   Relevant Orders   US OB Follow Up  PNV, [facility], PTL precautions US growth nv  TDaP today.  Flu shot UTD [facility]. Labs discussed, normal glc and mild anemia  Annamarie MajorPaul Harris, MD, Merlinda FrederickFACOG [facility], [facility] 03/31/2017  9:01 AM

## 2017-03-31 NOTE — Addendum Note (Signed)
Addended by: Cornelius MorasPATTERSON, Phoua Hoadley D on: 03/31/2017 09:19 AM   Modules accepted: Orders

## 2017-03-31 NOTE — Patient Instructions (Signed)
Third Trimester of Pregnancy The third trimester is from week 28 through week 40 (months 7 through 9). The third trimester is a time when the unborn baby (fetus) is growing rapidly. At the end of the ninth month, the fetus is about 20 inches in length and weighs 6-10 pounds. Body changes during your third trimester Your body will continue to go through many changes during pregnancy. The changes vary from woman to woman. During the third trimester:  Your weight will continue to increase. You can expect to gain 25-35 pounds (11-16 kg) by the end of the pregnancy.  You may begin to get stretch marks on your hips, abdomen, and breasts.  You may urinate more often because the fetus is moving lower into your pelvis and pressing on your bladder.  You may develop or continue to have heartburn. This is caused by increased hormones that slow down muscles in the digestive tract.  You may develop or continue to have constipation because increased hormones slow digestion and cause the muscles that push waste through your intestines to relax.  You may develop hemorrhoids. These are swollen veins (varicose veins) in the rectum that can itch or be painful.  You may develop swollen, bulging veins (varicose veins) in your legs.  You may have increased body aches in the pelvis, back, or thighs. This is due to weight gain and increased hormones that are relaxing your joints.  You may have changes in your hair. These can include thickening of your hair, rapid growth, and changes in texture. Some women also have hair loss during or after pregnancy, or hair that feels dry or thin. Your hair will most likely return to normal after your baby is born.  Your breasts will continue to grow and they will continue to become tender. A yellow fluid (colostrum) may leak from your breasts. This is the first milk you are producing for your baby.  Your belly button may stick out.  You may notice more swelling in your hands,  face, or ankles.  You may have increased tingling or numbness in your hands, arms, and legs. The skin on your belly may also feel numb.  You may feel short of breath because of your expanding uterus.  You may have more problems sleeping. This can be caused by the size of your belly, increased need to urinate, and an increase in your body's metabolism.  You may notice the fetus "dropping," or moving lower in your abdomen (lightening).  You may have increased vaginal discharge.  You may notice your joints feel loose and you may have pain around your pelvic bone.  What to expect at prenatal visits You will have prenatal exams every 2 weeks until week 36. Then you will have weekly prenatal exams. During a routine prenatal visit:  You will be weighed to make sure you and the baby are growing normally.  Your blood pressure will be taken.  Your abdomen will be measured to track your baby's growth.  The fetal heartbeat will be listened to.  Any test results from the previous visit will be discussed.  You may have a cervical check near your due date to see if your cervix has softened or thinned (effaced).  You will be tested for Group B streptococcus. This happens between 35 and 37 weeks.  Your health care provider may ask you:  What your birth plan is.  How you are feeling.  If you are feeling the baby move.  If you have had   any abnormal symptoms, such as leaking fluid, bleeding, severe headaches, or abdominal cramping.  If you are using any tobacco products, including cigarettes, chewing tobacco, and electronic cigarettes.  If you have any questions.  Other tests or screenings that may be performed during your third trimester include:  Blood tests that check for low iron levels (anemia).  Fetal testing to check the health, activity level, and growth of the fetus. Testing is done if you have certain medical conditions or if there are problems during the  pregnancy.  Nonstress test (NST). This test checks the health of your baby to make sure there are no signs of problems, such as the baby not getting enough oxygen. During this test, a belt is placed around your belly. The baby is made to move, and its heart rate is monitored during movement.  What is false labor? False labor is a condition in which you feel small, irregular tightenings of the muscles in the womb (contractions) that usually go away with rest, changing position, or drinking water. These are called Braxton Hicks contractions. Contractions may last for hours, days, or even weeks before true labor sets in. If contractions come at regular intervals, become more frequent, increase in intensity, or become painful, you should see your health care provider. What are the signs of labor?  Abdominal cramps.  Regular contractions that start at 10 minutes apart and become stronger and more frequent with time.  Contractions that start on the top of the uterus and spread down to the lower abdomen and back.  Increased pelvic pressure and dull back pain.  A watery or bloody mucus discharge that comes from the vagina.  Leaking of amniotic fluid. This is also known as your "water breaking." It could be a slow trickle or a gush. Let your health care provider know if it has a color or strange odor. If you have any of these signs, call your health care provider right away, even if it is before your due date. Follow these instructions at home: Medicines  Follow your health care provider's instructions regarding medicine use. Specific medicines may be either safe or unsafe to take during pregnancy.  Take a prenatal vitamin that contains at least 600 micrograms (mcg) of folic acid.  If you develop constipation, try taking a stool softener if your health care provider approves. Eating and drinking  Eat a balanced diet that includes fresh fruits and vegetables, whole grains, good sources of protein  such as meat, eggs, or tofu, and low-fat dairy. Your health care provider will help you determine the amount of weight gain that is right for you.  Avoid raw meat and uncooked cheese. These carry germs that can cause birth defects in the baby.  If you have low calcium intake from food, talk to your health care provider about whether you should take a daily calcium supplement.  Eat four or five small meals rather than three large meals a day.  Limit foods that are high in fat and processed sugars, such as fried and sweet foods.  To prevent constipation: ? Drink enough fluid to keep your urine clear or pale yellow. ? Eat foods that are high in fiber, such as fresh fruits and vegetables, whole grains, and beans. Activity  Exercise only as directed by your health care provider. Most women can continue their usual exercise routine during pregnancy. Try to exercise for 30 minutes at least 5 days a week. Stop exercising if you experience uterine contractions.  Avoid heavy   lifting.  Do not exercise in extreme heat or humidity, or at high altitudes.  Wear low-heel, comfortable shoes.  Practice good posture.  You may continue to have sex unless your health care provider tells you otherwise. Relieving pain and discomfort  Take frequent breaks and rest with your legs elevated if you have leg cramps or low back pain.  Take warm sitz baths to soothe any pain or discomfort caused by hemorrhoids. Use hemorrhoid cream if your health care provider approves.  Wear a good support bra to prevent discomfort from breast tenderness.  If you develop varicose veins: ? Wear support pantyhose or compression stockings as told by your healthcare provider. ? Elevate your feet for 15 minutes, 3-4 times a day. Prenatal care  Write down your questions. Take them to your prenatal visits.  Keep all your prenatal visits as told by your health care provider. This is important. Safety  Wear your seat belt at  all times when driving.  Make a list of emergency phone numbers, including numbers for family, friends, the hospital, and police and fire departments. General instructions  Avoid cat litter boxes and soil used by cats. These carry germs that can cause birth defects in the baby. If you have a cat, ask someone to clean the litter box for you.  Do not travel far distances unless it is absolutely necessary and only with the approval of your health care provider.  Do not use hot tubs, steam rooms, or saunas.  Do not drink alcohol.  Do not use any products that contain nicotine or tobacco, such as cigarettes and e-cigarettes. If you need help quitting, ask your health care provider.  Do not use any medicinal herbs or unprescribed drugs. These chemicals affect the formation and growth of the baby.  Do not douche or use tampons or scented sanitary pads.  Do not cross your legs for long periods of time.  To prepare for the arrival of your baby: ? Take prenatal classes to understand, practice, and ask questions about labor and delivery. ? Make a trial run to the hospital. ? Visit the hospital and tour the maternity area. ? Arrange for maternity or paternity leave through employers. ? Arrange for family and friends to take care of pets while you are in the hospital. ? Purchase a rear-facing car seat and make sure you know how to install it in your car. ? Pack your hospital bag. ? Prepare the baby's nursery. Make sure to remove all pillows and stuffed animals from the baby's crib to prevent suffocation.  Visit your dentist if you have not gone during your pregnancy. Use a soft toothbrush to brush your teeth and be gentle when you floss. Contact a health care provider if:  You are unsure if you are in labor or if your water has broken.  You become dizzy.  You have mild pelvic cramps, pelvic pressure, or nagging pain in your abdominal area.  You have lower back pain.  You have persistent  nausea, vomiting, or diarrhea.  You have an unusual or bad smelling vaginal discharge.  You have pain when you urinate. Get help right away if:  Your water breaks before 37 weeks.  You have regular contractions less than 5 minutes apart before 37 weeks.  You have a fever.  You are leaking fluid from your vagina.  You have spotting or bleeding from your vagina.  You have severe abdominal pain or cramping.  You have rapid weight loss or weight gain.    You have shortness of breath with chest pain.  You notice sudden or extreme swelling of your face, hands, ankles, feet, or legs.  Your baby makes fewer than 10 movements in 2 hours.  You have severe headaches that do not go away when you take medicine.  You have vision changes. Summary  The third trimester is from week 28 through week 40, months 7 through 9. The third trimester is a time when the unborn baby (fetus) is growing rapidly.  During the third trimester, your discomfort may increase as you and your baby continue to gain weight. You may have abdominal, leg, and back pain, sleeping problems, and an increased need to urinate.  During the third trimester your breasts will keep growing and they will continue to become tender. A yellow fluid (colostrum) may leak from your breasts. This is the first milk you are producing for your baby.  False labor is a condition in which you feel small, irregular tightenings of the muscles in the womb (contractions) that eventually go away. These are called Braxton Hicks contractions. Contractions may last for hours, days, or even weeks before true labor sets in.  Signs of labor can include: abdominal cramps; regular contractions that start at 10 minutes apart and become stronger and more frequent with time; watery or bloody mucus discharge that comes from the vagina; increased pelvic pressure and dull back pain; and leaking of amniotic fluid. This information is not intended to replace advice  given to you by your health care provider. Make sure you discuss any questions you have with your health care provider. Document Released: 02/10/2001 Document Revised: 07/25/2015 Document Reviewed: 04/19/2012 Elsevier Interactive Patient Education  2017 Elsevier Inc.  

## 2017-04-14 ENCOUNTER — Ambulatory Visit (INDEPENDENT_AMBULATORY_CARE_PROVIDER_SITE_OTHER): Payer: BLUE CROSS/BLUE SHIELD

## 2017-04-14 ENCOUNTER — Ambulatory Visit (INDEPENDENT_AMBULATORY_CARE_PROVIDER_SITE_OTHER): Payer: BLUE CROSS/BLUE SHIELD | Admitting: Certified Nurse Midwife

## 2017-04-14 VITALS — BP 110/60 | Wt 157.0 lb

## 2017-04-14 DIAGNOSIS — Z3400 Encounter for supervision of normal first pregnancy, unspecified trimester: Secondary | ICD-10-CM

## 2017-04-14 DIAGNOSIS — Z3A29 29 weeks gestation of pregnancy: Secondary | ICD-10-CM

## 2017-04-14 DIAGNOSIS — Z3A31 31 weeks gestation of pregnancy: Secondary | ICD-10-CM

## 2017-04-14 NOTE — Progress Notes (Signed)
ROB and growth scan at 31wk5days: Discussed dating of pregnancy and answered questions Baby Leonette MostCharles is active/ Breast feeding FH=32/FHT 134 Growth scan: 4#6oz (58th%), AFI=12.51cm/ cephalic DIscussed options for contraception when breast feeding [facility] and preregistration instructions given ROB in 2 weeks. Given prenatal class calender Farrel Connersolleen Jadan Hinojos, CNM

## 2017-04-14 NOTE — Progress Notes (Signed)
Pt reports no problems. Growth u/s today.

## 2017-04-28 ENCOUNTER — Ambulatory Visit (INDEPENDENT_AMBULATORY_CARE_PROVIDER_SITE_OTHER): Payer: BLUE CROSS/BLUE SHIELD | Admitting: Advanced Practice Midwife

## 2017-04-28 ENCOUNTER — Encounter: Payer: Self-pay | Admitting: Advanced Practice Midwife

## 2017-04-28 VITALS — BP 112/70 | Wt 162.0 lb

## 2017-04-28 DIAGNOSIS — Z3A33 33 weeks gestation of pregnancy: Secondary | ICD-10-CM

## 2017-04-28 NOTE — Patient Instructions (Signed)
Third Trimester of Pregnancy The third trimester is from week 28 through week 40 (months 7 through 9). The third trimester is a time when the unborn baby (fetus) is growing rapidly. At the end of the ninth month, the fetus is about 20 inches in length and weighs 6-10 pounds. Body changes during your third trimester Your body will continue to go through many changes during pregnancy. The changes vary from woman to woman. During the third trimester:  Your weight will continue to increase. You can expect to gain 25-35 pounds (11-16 kg) by the end of the pregnancy.  You may begin to get stretch marks on your hips, abdomen, and breasts.  You may urinate more often because the fetus is moving lower into your pelvis and pressing on your bladder.  You may develop or continue to have heartburn. This is caused by increased hormones that slow down muscles in the digestive tract.  You may develop or continue to have constipation because increased hormones slow digestion and cause the muscles that push waste through your intestines to relax.  You may develop hemorrhoids. These are swollen veins (varicose veins) in the rectum that can itch or be painful.  You may develop swollen, bulging veins (varicose veins) in your legs.  You may have increased body aches in the pelvis, back, or thighs. This is due to weight gain and increased hormones that are relaxing your joints.  You may have changes in your hair. These can include thickening of your hair, rapid growth, and changes in texture. Some women also have hair loss during or after pregnancy, or hair that feels dry or thin. Your hair will most likely return to normal after your baby is born.  Your breasts will continue to grow and they will continue to become tender. A yellow fluid (colostrum) may leak from your breasts. This is the first milk you are producing for your baby.  Your belly button may stick out.  You may notice more swelling in your hands,  face, or ankles.  You may have increased tingling or numbness in your hands, arms, and legs. The skin on your belly may also feel numb.  You may feel short of breath because of your expanding uterus.  You may have more problems sleeping. This can be caused by the size of your belly, increased need to urinate, and an increase in your body's metabolism.  You may notice the fetus "dropping," or moving lower in your abdomen (lightening).  You may have increased vaginal discharge.  You may notice your joints feel loose and you may have pain around your pelvic bone.  What to expect at prenatal visits You will have prenatal exams every 2 weeks until week 36. Then you will have weekly prenatal exams. During a routine prenatal visit:  You will be weighed to make sure you and the baby are growing normally.  Your blood pressure will be taken.  Your abdomen will be measured to track your baby's growth.  The fetal heartbeat will be listened to.  Any test results from the previous visit will be discussed.  You may have a cervical check near your due date to see if your cervix has softened or thinned (effaced).  You will be tested for Group B streptococcus. This happens between 35 and 37 weeks.  Your health care provider may ask you:  What your birth plan is.  How you are feeling.  If you are feeling the baby move.  If you have had   any abnormal symptoms, such as leaking fluid, bleeding, severe headaches, or abdominal cramping.  If you are using any tobacco products, including cigarettes, chewing tobacco, and electronic cigarettes.  If you have any questions.  Other tests or screenings that may be performed during your third trimester include:  Blood tests that check for low iron levels (anemia).  Fetal testing to check the health, activity level, and growth of the fetus. Testing is done if you have certain medical conditions or if there are problems during the  pregnancy.  Nonstress test (NST). This test checks the health of your baby to make sure there are no signs of problems, such as the baby not getting enough oxygen. During this test, a belt is placed around your belly. The baby is made to move, and its heart rate is monitored during movement.  What is false labor? False labor is a condition in which you feel small, irregular tightenings of the muscles in the womb (contractions) that usually go away with rest, changing position, or drinking water. These are called Braxton Hicks contractions. Contractions may last for hours, days, or even weeks before true labor sets in. If contractions come at regular intervals, become more frequent, increase in intensity, or become painful, you should see your health care provider. What are the signs of labor?  Abdominal cramps.  Regular contractions that start at 10 minutes apart and become stronger and more frequent with time.  Contractions that start on the top of the uterus and spread down to the lower abdomen and back.  Increased pelvic pressure and dull back pain.  A watery or bloody mucus discharge that comes from the vagina.  Leaking of amniotic fluid. This is also known as your "water breaking." It could be a slow trickle or a gush. Let your health care provider know if it has a color or strange odor. If you have any of these signs, call your health care provider right away, even if it is before your due date. Follow these instructions at home: Medicines  Follow your health care provider's instructions regarding medicine use. Specific medicines may be either safe or unsafe to take during pregnancy.  Take a prenatal vitamin that contains at least 600 micrograms (mcg) of folic acid.  If you develop constipation, try taking a stool softener if your health care provider approves. Eating and drinking  Eat a balanced diet that includes fresh fruits and vegetables, whole grains, good sources of protein  such as meat, eggs, or tofu, and low-fat dairy. Your health care provider will help you determine the amount of weight gain that is right for you.  Avoid raw meat and uncooked cheese. These carry germs that can cause birth defects in the baby.  If you have low calcium intake from food, talk to your health care provider about whether you should take a daily calcium supplement.  Eat four or five small meals rather than three large meals a day.  Limit foods that are high in fat and processed sugars, such as fried and sweet foods.  To prevent constipation: ? Drink enough fluid to keep your urine clear or pale yellow. ? Eat foods that are high in fiber, such as fresh fruits and vegetables, whole grains, and beans. Activity  Exercise only as directed by your health care provider. Most women can continue their usual exercise routine during pregnancy. Try to exercise for 30 minutes at least 5 days a week. Stop exercising if you experience uterine contractions.  Avoid heavy   lifting.  Do not exercise in extreme heat or humidity, or at high altitudes.  Wear low-heel, comfortable shoes.  Practice good posture.  You may continue to have sex unless your health care provider tells you otherwise. Relieving pain and discomfort  Take frequent breaks and rest with your legs elevated if you have leg cramps or low back pain.  Take warm sitz baths to soothe any pain or discomfort caused by hemorrhoids. Use hemorrhoid cream if your health care provider approves.  Wear a good support bra to prevent discomfort from breast tenderness.  If you develop varicose veins: ? Wear support pantyhose or compression stockings as told by your healthcare provider. ? Elevate your feet for 15 minutes, 3-4 times a day. Prenatal care  Write down your questions. Take them to your prenatal visits.  Keep all your prenatal visits as told by your health care provider. This is important. Safety  Wear your seat belt at  all times when driving.  Make a list of emergency phone numbers, including numbers for family, friends, the hospital, and police and fire departments. General instructions  Avoid cat litter boxes and soil used by cats. These carry germs that can cause birth defects in the baby. If you have a cat, ask someone to clean the litter box for you.  Do not travel far distances unless it is absolutely necessary and only with the approval of your health care provider.  Do not use hot tubs, steam rooms, or saunas.  Do not drink alcohol.  Do not use any products that contain nicotine or tobacco, such as cigarettes and e-cigarettes. If you need help quitting, ask your health care provider.  Do not use any medicinal herbs or unprescribed drugs. These chemicals affect the formation and growth of the baby.  Do not douche or use tampons or scented sanitary pads.  Do not cross your legs for long periods of time.  To prepare for the arrival of your baby: ? Take prenatal classes to understand, practice, and ask questions about labor and delivery. ? Make a trial run to the hospital. ? Visit the hospital and tour the maternity area. ? Arrange for maternity or paternity leave through employers. ? Arrange for family and friends to take care of pets while you are in the hospital. ? Purchase a rear-facing car seat and make sure you know how to install it in your car. ? Pack your hospital bag. ? Prepare the baby's nursery. Make sure to remove all pillows and stuffed animals from the baby's crib to prevent suffocation.  Visit your dentist if you have not gone during your pregnancy. Use a soft toothbrush to brush your teeth and be gentle when you floss. Contact a health care provider if:  You are unsure if you are in labor or if your water has broken.  You become dizzy.  You have mild pelvic cramps, pelvic pressure, or nagging pain in your abdominal area.  You have lower back pain.  You have persistent  nausea, vomiting, or diarrhea.  You have an unusual or bad smelling vaginal discharge.  You have pain when you urinate. Get help right away if:  Your water breaks before 37 weeks.  You have regular contractions less than 5 minutes apart before 37 weeks.  You have a fever.  You are leaking fluid from your vagina.  You have spotting or bleeding from your vagina.  You have severe abdominal pain or cramping.  You have rapid weight loss or weight gain.    You have shortness of breath with chest pain.  You notice sudden or extreme swelling of your face, hands, ankles, feet, or legs.  Your baby makes fewer than 10 movements in 2 hours.  You have severe headaches that do not go away when you take medicine.  You have vision changes. Summary  The third trimester is from week 28 through week 40, months 7 through 9. The third trimester is a time when the unborn baby (fetus) is growing rapidly.  During the third trimester, your discomfort may increase as you and your baby continue to gain weight. You may have abdominal, leg, and back pain, sleeping problems, and an increased need to urinate.  During the third trimester your breasts will keep growing and they will continue to become tender. A yellow fluid (colostrum) may leak from your breasts. This is the first milk you are producing for your baby.  False labor is a condition in which you feel small, irregular tightenings of the muscles in the womb (contractions) that eventually go away. These are called Braxton Hicks contractions. Contractions may last for hours, days, or even weeks before true labor sets in.  Signs of labor can include: abdominal cramps; regular contractions that start at 10 minutes apart and become stronger and more frequent with time; watery or bloody mucus discharge that comes from the vagina; increased pelvic pressure and dull back pain; and leaking of amniotic fluid. This information is not intended to replace advice  given to you by your health care provider. Make sure you discuss any questions you have with your health care provider. Document Released: 02/10/2001 Document Revised: 07/25/2015 Document Reviewed: 04/19/2012 Elsevier Interactive Patient Education  2017 Elsevier Inc.  

## 2017-04-28 NOTE — Progress Notes (Signed)
ROB

## 2017-04-28 NOTE — Progress Notes (Signed)
  Routine Prenatal Care Visit  Subjective  Jenna Marks is a 22 y.o. G1P0 at 5623w5d being seen today for ongoing prenatal care.  She is currently monitored for the following issues for this low-risk pregnancy and has Back pain; Supervision of normal first pregnancy, antepartum; and Anemia complicating pregnancy in second trimester on their problem list.  ----------------------------------------------------------------------------------- Patient reports no complaints.   Contractions: Regular. Vag. Bleeding: None.  Movement: Present. Denies leaking of fluid.  ----------------------------------------------------------------------------------- The following portions of the patient's history were reviewed and updated as appropriate: allergies, current medications, past family history, past medical history, past social history, past surgical history and problem list. Problem list updated.   Objective  Blood pressure 112/70, weight 162 lb (73.5 kg), last menstrual period 09/16/2016. Pregravid weight 130 lb (59 kg) Total Weight Gain 32 lb (14.5 kg) Urinalysis: Urine Protein: Negative Urine Glucose: Negative  Fetal Status: Fetal Heart Rate (bpm): 138 Fundal Height: 33 cm Movement: Present     General:  Alert, oriented and cooperative. Patient is in no acute distress.  Skin: Skin is warm and dry. No rash noted.   Cardiovascular: Normal heart rate noted  Respiratory: Normal respiratory effort, no problems with respiration noted  Abdomen: Soft, gravid, appropriate for gestational age. Pain/Pressure: Absent     Pelvic:  Cervical exam deferred        Extremities: Normal range of motion.  Edema: None  Mental Status: Normal mood and affect. Normal behavior. Normal judgment and thought content.   Assessment   22 y.o. G1P0 at 5023w5d by  06/11/2017, by Ultrasound presenting for routine prenatal visit  Plan   G1 Problems (from 01/22/17 to present)    Problem Noted Resolved   Supervision of normal  first pregnancy, antepartum 03/09/2017 by Oswaldo ConroySchmid, Jacelyn Y, CNM No   Overview Addendum 03/30/2017  4:52 PM by Oswaldo ConroySchmid, Jacelyn Y, CNM    Clinic [facility] Prenatal Labs  Dating LMP Blood type: O Positive  Genetic Screen Quad: Negative (done at [facility]) Antibody: Negative  Anatomic US Complete Rubella: 1.10 (01/25 1007) Varicella: Non-immune  GTT Third trimester: 100 RPR: Non-reactive  Rhogam  HBsAg: Negative  TDaP vaccine                   Flu Shot: 01/04/17 HIV:Non-reactive   Baby Food Breast                              GBS:   Contraception Undecided Pap: Not indicated due to age  CBB     CS/VBAC    Support Person FOB - [place]             Anemia complicating pregnancy in second trimester 03/09/2017 by Oswaldo ConroySchmid, Jacelyn Y, CNM No       Preterm labor symptoms and general obstetric precautions including but not limited to vaginal bleeding, contractions, leaking of fluid and fetal movement were reviewed in detail with the patient. Please refer to After Visit Summary for other counseling recommendations.   Return in about 2 weeks (around 05/12/2017) for rob.  Tresea MallJane Laurel Smeltz, CNM  04/28/2017 9:46 AM

## 2017-05-12 ENCOUNTER — Encounter: Payer: Self-pay | Admitting: Obstetrics and Gynecology

## 2017-05-12 ENCOUNTER — Ambulatory Visit (INDEPENDENT_AMBULATORY_CARE_PROVIDER_SITE_OTHER): Payer: BLUE CROSS/BLUE SHIELD | Admitting: Obstetrics and Gynecology

## 2017-05-12 VITALS — BP 120/70 | Wt 165.0 lb

## 2017-05-12 DIAGNOSIS — Z3400 Encounter for supervision of normal first pregnancy, unspecified trimester: Secondary | ICD-10-CM

## 2017-05-12 DIAGNOSIS — O99012 Anemia complicating pregnancy, second trimester: Secondary | ICD-10-CM

## 2017-05-12 DIAGNOSIS — Z3A35 35 weeks gestation of pregnancy: Secondary | ICD-10-CM

## 2017-05-12 MED ORDER — FERROUS SULFATE 325 (65 FE) MG PO TABS: 325.0000 mg | ORAL_TABLET | Freq: Two times a day (BID) | ORAL | 1 refills | Status: DC

## 2017-05-12 NOTE — Progress Notes (Signed)
    Routine Prenatal Care Visit  Subjective  Jenna Marks is a 22 y.o. G1P0 at 4980w5d being seen today for ongoing prenatal care.  She is currently monitored for the following issues for this low-risk pregnancy and has Back pain; Supervision of normal first pregnancy, antepartum; and Anemia complicating pregnancy in second trimester on their problem list.  ----------------------------------------------------------------------------------- Patient reports no complaints.   Contractions: Not present. Vag. Bleeding: None.  Movement: Present. Denies leaking of fluid.  ----------------------------------------------------------------------------------- The following portions of the patient's history were reviewed and updated as appropriate: allergies, current medications, past family history, past medical history, past social history, past surgical history and problem list. Problem list updated.   Objective  Blood pressure 120/70, weight 165 lb (74.8 kg), last menstrual period 09/16/2016. Pregravid weight 130 lb (59 kg) Total Weight Gain 35 lb (15.9 kg) Urinalysis: Urine Protein: Negative Urine Glucose: Negative  Fetal Status: Fetal Heart Rate (bpm): 141 Fundal Height: 34 cm Movement: Present     General:  Alert, oriented and cooperative. Patient is in no acute distress.  Skin: Skin is warm and dry. No rash noted.   Cardiovascular: Normal heart rate noted  Respiratory: Normal respiratory effort, no problems with respiration noted  Abdomen: Soft, gravid, appropriate for gestational age. Pain/Pressure: Absent     Pelvic:  Cervical exam deferred        Extremities: Normal range of motion.     ental Status: Normal mood and affect. Normal behavior. Normal judgment and thought content.     Assessment   22 y.o. G1P0 at 5680w5d by  06/11/2017, by Ultrasound presenting for routine prenatal visit  Plan   G1 Problems (from 01/22/17 to present)    Problem Noted Resolved   Supervision of normal  first pregnancy, antepartum 03/09/2017 by Oswaldo ConroySchmid, Jacelyn Y, CNM No   Overview Addendum 05/12/2017  9:44 AM by Natale MilchSchuman, Sixto Bowdish R, MD    [facility]inic [facility] Prenatal Labs  Dating LMP Blood type: O Positive  Genetic Screen Quad: Negative (done at [facility]) Antibody: Negative  Anatomic US Complete Rubella: 1.10 (01/25 1007) Varicella: Non-immune  GTT Third trimester: 100 RPR: Non-reactive  Rhogam  Not needed HBsAg: Negative  TDaP vaccine  03/31/16                  Flu Shot: 01/04/17 HIV:Non-reactive   Baby Food Breast                              GBS:   Contraception Undecided Pap: Not indicated due to age  CBB   Given information   CS/VBAC    Support Person FOB - [place]             Anemia complicating pregnancy in second trimester 03/09/2017 by Oswaldo ConroySchmid, Jacelyn Y, CNM No       Gestational age appropriate obstetric precautions including but not limited to vaginal bleeding, contractions, leaking of fluid and fetal movement were reviewed in detail with the patient.    Birth control counseling, patient considering pill versus an implantable device. Dicussed depo then transitioning to an OCP Given information on cord blood banking Patient declined GBS gonorrhea and chlamydia this week, would like to wait until next week for collection Return in about 1 week (around 05/19/2017) for ROB.    Adelene Idlerhristanna Arshia Spellman MD [facility], [facility] 05/12/2017, 9:45 AM

## 2017-05-19 ENCOUNTER — Ambulatory Visit (INDEPENDENT_AMBULATORY_CARE_PROVIDER_SITE_OTHER): Payer: BLUE CROSS/BLUE SHIELD | Admitting: Maternal Newborn

## 2017-05-19 ENCOUNTER — Encounter: Payer: Self-pay | Admitting: Maternal Newborn

## 2017-05-19 VITALS — BP 130/78 | Wt 170.0 lb

## 2017-05-19 DIAGNOSIS — Z3A36 36 weeks gestation of pregnancy: Secondary | ICD-10-CM

## 2017-05-19 DIAGNOSIS — Z3400 Encounter for supervision of normal first pregnancy, unspecified trimester: Secondary | ICD-10-CM

## 2017-05-19 NOTE — Progress Notes (Signed)
    Routine Prenatal Care Visit  Subjective  Jenna Marks is a 22 y.o. G1P0 at 10918w5d being seen today for ongoing prenatal care.  She is currently monitored for the following issues for this low-risk pregnancy and has Back pain; Supervision of normal first pregnancy, antepartum; and Anemia complicating pregnancy in second trimester on their problem list.  ----------------------------------------------------------------------------------- Patient reports ongoing carpal tunnel symptoms in left hand, better when she wears a brace. Contractions: Not present. Vag. Bleeding: None.  Movement: Present. Denies leaking of fluid.  ----------------------------------------------------------------------------------- The following portions of the patient's history were reviewed and updated as appropriate: allergies, current medications, past family history, past medical history, past social history, past surgical history and problem list. Problem list updated.   Objective  Blood pressure 130/78, weight 170 lb (77.1 kg), last menstrual period 09/16/2016. Pregravid weight 130 lb (59 kg) Total Weight Gain 40 lb (18.1 kg) Urinalysis: Urine Protein: Negative Urine Glucose: Negative  Fetal Status: Fetal Heart Rate (bpm): 148 Fundal Height: 35 cm Movement: Present     General:  Alert, oriented and cooperative. Patient is in no acute distress.  Skin: Skin is warm and dry. No rash noted.   Cardiovascular: Normal heart rate noted  Respiratory: Normal respiratory effort, no problems with respiration noted  Abdomen: Soft, gravid, appropriate for gestational age. Pain/Pressure: Absent     Pelvic:  Cervical exam deferred        Extremities: Normal range of motion.  Edema: Trace  Mental Status: Normal mood and affect. Normal behavior. Normal judgment and thought content.   Trace edema in hands and ankles.  Assessment   22 y.o. G1P0 at 3218w5d by  06/11/2017, by Ultrasound presenting for routineprenatal  visit.  Plan   G1 Problems (from 01/22/17 to present)    Problem Noted Resolved   Supervision of normal first pregnancy, antepartum 03/09/2017 by Oswaldo ConroySchmid, Jacelyn Y, CNM No   Overview Addendum 05/12/2017  9:44 AM by Natale MilchSchuman, Christanna R, MD    [facility]inic [facility] Prenatal Labs  Dating LMP Blood type: O Positive  Genetic Screen Quad: Negative (done at [facility]) Antibody: Negative  Anatomic US Complete Rubella: 1.10 (01/25 1007) Varicella: Non-immune  GTT Third trimester: 100 RPR: Non-reactive  Rhogam  Not needed HBsAg: Negative  TDaP vaccine  03/31/16                  Flu Shot: 01/04/17 HIV:Non-reactive   Baby Food Breast                              GBS:   Contraception Undecided Pap: Not indicated due to age  CBB   Given information   CS/VBAC    Support Person FOB - Jenna Marks             Anemia complicating pregnancy in second trimester 03/09/2017 by Oswaldo ConroySchmid, Jacelyn Y, CNM No    GBS and GC today. Discussed timing of spontaneous onset of labor.  Preterm labor symptoms and general obstetric precautions including but not limited to vaginal bleeding, contractions, leaking of fluid and fetal movement were reviewed with the patient.  Return in about 1 week (around 05/26/2017) for ROB.  Marcelyn BruinsJacelyn Schmid, CNM 05/19/2017  9:56 AM

## 2017-05-19 NOTE — Progress Notes (Signed)
No concerns.rj 

## 2017-05-21 LAB — STREP GP B NAA: STREP GROUP B AG: NEGATIVE

## 2017-05-21 LAB — GC/CHLAMYDIA PROBE AMP
Chlamydia trachomatis, NAA: NEGATIVE
NEISSERIA GONORRHOEAE BY PCR: NEGATIVE

## 2017-05-26 ENCOUNTER — Encounter: Payer: Self-pay | Admitting: Maternal Newborn

## 2017-05-26 ENCOUNTER — Ambulatory Visit (INDEPENDENT_AMBULATORY_CARE_PROVIDER_SITE_OTHER): Payer: BLUE CROSS/BLUE SHIELD | Admitting: Maternal Newborn

## 2017-05-26 VITALS — BP 130/84 | Wt 171.0 lb

## 2017-05-26 DIAGNOSIS — O9989 Other specified diseases and conditions complicating pregnancy, childbirth and the puerperium: Secondary | ICD-10-CM

## 2017-05-26 DIAGNOSIS — Z3A37 37 weeks gestation of pregnancy: Secondary | ICD-10-CM

## 2017-05-26 DIAGNOSIS — Z3400 Encounter for supervision of normal first pregnancy, unspecified trimester: Secondary | ICD-10-CM

## 2017-05-26 DIAGNOSIS — O99891 Other specified diseases and conditions complicating pregnancy: Secondary | ICD-10-CM

## 2017-05-26 DIAGNOSIS — M549 Dorsalgia, unspecified: Secondary | ICD-10-CM | POA: Diagnosis not present

## 2017-05-26 LAB — POCT URINALYSIS DIPSTICK
BILIRUBIN UA: NEGATIVE
GLUCOSE UA: NEGATIVE
KETONES UA: NEGATIVE
Nitrite, UA: NEGATIVE
RBC UA: NEGATIVE
Urobilinogen, UA: NEGATIVE E.U./dL — AB
pH, UA: 6 (ref 5.0–8.0)

## 2017-05-26 NOTE — Progress Notes (Signed)
    Routine Prenatal Care Visit  Subjective  Jenna Marks is a 22 y.o. G1P0 at 7510w5d being seen today for ongoing prenatal care.  She is currently monitored for the following issues for this low-risk pregnancy and has Back pain; Supervision of normal first pregnancy, antepartum; and Anemia complicating pregnancy in second trimester on their problem list.  ----------------------------------------------------------------------------------- Patient reports backache and more frequent than usual urination. Denies dysuria.   Contractions: Irregular. Vag. Bleeding: None.  Movement: Present. Denies leaking of fluid.  ----------------------------------------------------------------------------------- The following portions of the patient's history were reviewed and updated as appropriate: allergies, current medications, past family history, past medical history, past social history, past surgical history and problem list. Problem list updated.   Objective  Blood pressure 130/84, weight 171 lb (77.6 kg), last menstrual period 09/16/2016. Pregravid weight 130 lb (59 kg) Total Weight Gain 41 lb (18.6 kg) Urinalysis: Urine Protein: Trace Urine Glucose: Negative  Moderate leukocytes, no nitrites.  Fetal Status: Fetal Heart Rate (bpm): 150 Fundal Height: 36 cm Movement: Present  Presentation: Vertex  General:  Alert, oriented and cooperative. Patient is in no acute distress.  Skin: Skin is warm and dry. No rash noted.   Cardiovascular: Normal heart rate noted  Respiratory: Normal respiratory effort, no problems with respiration noted  Abdomen: Soft, gravid, appropriate for gestational age. Pain/Pressure: Present     Pelvic:  Cervical exam performed     Station: -2  Extremities: Normal range of motion.  Edema: Trace  Mental Status: Normal mood and affect. Normal behavior. Normal judgment and thought content.   No CVAT. Unable to tolerate full cervical exam, unsure of cervical  dilation.  Assessment   22 y.o. G1P0 at 2610w5d, EDD 06/11/2017 by Ultrasound presenting for routine prenatal visit.  Plan   G1 Problems (from 01/22/17 to present)    Problem Noted Resolved   Supervision of normal first pregnancy, antepartum 03/09/2017 by Oswaldo ConroySchmid, Dory Verdun Y, CNM No   Overview Addendum 05/12/2017  9:44 AM by Natale MilchSchuman, Christanna R, MD    [facility]inic [facility] Prenatal Labs  Dating LMP Blood type: O Positive  Genetic Screen Quad: Negative (done at [facility]) Antibody: Negative  Anatomic US Complete Rubella: 1.10 (01/25 1007) Varicella: Non-immune  GTT Third trimester: 100 RPR: Non-reactive  Rhogam  Not needed HBsAg: Negative  TDaP vaccine  03/31/16                  Flu Shot: 01/04/17 HIV:Non-reactive   Baby Food Breast                              GBS:   Contraception Undecided Pap: Not indicated due to age  CBB   Given information   CS/VBAC    Support Person FOB - [place]             Anemia complicating pregnancy in second trimester 03/09/2017 by Oswaldo ConroySchmid, Shruti Arrey Y, CNM No    Urine culture sent to rule out UTI.  Term labor symptoms and general obstetric precautions including but not limited to vaginal bleeding, contractions, leaking of fluid and fetal movement were reviewed in detail with the patient.  Return in about 1 week (around 06/02/2017) for ROB.  Marcelyn BruinsJacelyn Flora Ratz, CNM 05/26/2017  9:21 AM

## 2017-05-26 NOTE — Progress Notes (Signed)
C/O has a loot of pelvic pressure and lower back pain - constant every day - see urinalysis rj

## 2017-05-28 LAB — URINE CULTURE

## 2017-05-29 ENCOUNTER — Inpatient Hospital Stay
Admission: EM | Admit: 2017-05-29 | Discharge: 2017-06-01 | DRG: 807 | Disposition: A | Discharge status: Home / Self Care | Payer: BLUE CROSS/BLUE SHIELD | Attending: Obstetrics and Gynecology | Admitting: Obstetrics and Gynecology

## 2017-05-29 DIAGNOSIS — Z3A38 38 weeks gestation of pregnancy: Secondary | ICD-10-CM

## 2017-05-29 DIAGNOSIS — O99012 Anemia complicating pregnancy, second trimester: Secondary | ICD-10-CM

## 2017-05-29 DIAGNOSIS — O9902 Anemia complicating childbirth: Principal | ICD-10-CM | POA: Diagnosis present

## 2017-05-29 DIAGNOSIS — D649 Anemia, unspecified: Secondary | ICD-10-CM | POA: Diagnosis present

## 2017-05-29 DIAGNOSIS — Z3400 Encounter for supervision of normal first pregnancy, unspecified trimester: Secondary | ICD-10-CM

## 2017-05-30 ENCOUNTER — Other Ambulatory Visit: Payer: Self-pay

## 2017-05-30 DIAGNOSIS — O9902 Anemia complicating childbirth: Secondary | ICD-10-CM | POA: Diagnosis present

## 2017-05-30 DIAGNOSIS — Z3483 Encounter for supervision of other normal pregnancy, third trimester: Secondary | ICD-10-CM | POA: Diagnosis present

## 2017-05-30 DIAGNOSIS — Z3A38 38 weeks gestation of pregnancy: Secondary | ICD-10-CM

## 2017-05-30 DIAGNOSIS — D649 Anemia, unspecified: Secondary | ICD-10-CM | POA: Diagnosis not present

## 2017-05-30 LAB — CBC
HCT: 31.5 % — ABNORMAL LOW (ref 35.0–47.0)
Hemoglobin: 10.7 g/dL — ABNORMAL LOW (ref 12.0–16.0)
MCH: 27.6 pg (ref 26.0–34.0)
MCHC: 34 g/dL (ref 32.0–36.0)
MCV: 81.1 fL (ref 80.0–100.0)
PLATELETS: 274 10*3/uL (ref 150–440)
RBC: 3.88 MIL/uL (ref 3.80–5.20)
RDW: 14.3 % (ref 11.5–14.5)
WBC: 14.4 10*3/uL — AB (ref 3.6–11.0)

## 2017-05-30 LAB — COMPREHENSIVE METABOLIC PANEL
ALT: 10 U/L — AB (ref 14–54)
AST: 18 U/L (ref 15–41)
Albumin: 3.1 g/dL — ABNORMAL LOW (ref 3.5–5.0)
Alkaline Phosphatase: 222 U/L — ABNORMAL HIGH (ref 38–126)
Anion gap: 9 (ref 5–15)
BILIRUBIN TOTAL: 0.4 mg/dL (ref 0.3–1.2)
BUN: 10 mg/dL (ref 6–20)
CALCIUM: 9.1 mg/dL (ref 8.9–10.3)
CHLORIDE: 107 mmol/L (ref 101–111)
CO2: 21 mmol/L — ABNORMAL LOW (ref 22–32)
CREATININE: 0.67 mg/dL (ref 0.44–1.00)
Glucose, Bld: 86 mg/dL (ref 65–99)
Potassium: 3.8 mmol/L (ref 3.5–5.1)
Sodium: 137 mmol/L (ref 135–145)
TOTAL PROTEIN: 7.2 g/dL (ref 6.5–8.1)

## 2017-05-30 LAB — TYPE AND SCREEN
ABO/RH(D): O POS
ANTIBODY SCREEN: NEGATIVE

## 2017-05-30 LAB — PROTEIN / CREATININE RATIO, URINE
Creatinine, Urine: 25 mg/dL
Protein Creatinine Ratio: 2.64 mg/mg{Cre} — ABNORMAL HIGH (ref 0.00–0.15)
TOTAL PROTEIN, URINE: 66 mg/dL

## 2017-05-30 MED ORDER — ONDANSETRON HCL 4 MG PO TABS: 4.0000 mg | ORAL_TABLET | ORAL | Status: DC | PRN

## 2017-05-30 MED ORDER — FERROUS SULFATE 325 (65 FE) MG PO TABS
325.0000 mg | ORAL_TABLET | Freq: Every day | ORAL | Status: DC
  Administered 2017-05-31 – 2017-06-01 (×2): 325 mg via ORAL
  Filled 2017-05-30 (×2): qty 1

## 2017-05-30 MED ORDER — LIDOCAINE HCL (PF) 1 % IJ SOLN
30.0000 mL | INTRAMUSCULAR | Status: DC | PRN
Start: 2017-05-30 — End: 2017-05-30
  Administered 2017-05-30: 30 mL via SUBCUTANEOUS

## 2017-05-30 MED ORDER — IBUPROFEN 600 MG PO TABS
600.0000 mg | ORAL_TABLET | Freq: Four times a day (QID) | ORAL | Status: DC
  Administered 2017-05-30 – 2017-06-01 (×8): 600 mg via ORAL
  Filled 2017-05-30 (×8): qty 1

## 2017-05-30 MED ORDER — SENNOSIDES-DOCUSATE SODIUM 8.6-50 MG PO TABS
2.0000 | ORAL_TABLET | ORAL | Status: DC
  Administered 2017-05-31: 2 via ORAL
  Filled 2017-05-30: qty 2

## 2017-05-30 MED ORDER — WITCH HAZEL-GLYCERIN EX PADS: 1.0000 "application " | MEDICATED_PAD | CUTANEOUS | Status: DC | PRN

## 2017-05-30 MED ORDER — ACETAMINOPHEN 325 MG PO TABS: 650.0000 mg | ORAL_TABLET | ORAL | Status: DC | PRN

## 2017-05-30 MED ORDER — OXYTOCIN BOLUS FROM INFUSION
500.0000 mL | Freq: Once | INTRAVENOUS | Status: AC
  Administered 2017-05-30: 500 mL via INTRAVENOUS

## 2017-05-30 MED ORDER — LACTATED RINGERS IV SOLN
INTRAVENOUS | Status: DC
  Administered 2017-05-30: 01:00:00 via INTRAVENOUS

## 2017-05-30 MED ORDER — LACTATED RINGERS IV SOLN: 500.0000 mL | INTRAVENOUS | Status: DC | PRN

## 2017-05-30 MED ORDER — OXYTOCIN 40 UNITS IN LACTATED RINGERS INFUSION - SIMPLE MED
2.5000 [IU]/h | INTRAVENOUS | Status: DC
  Filled 2017-05-30: qty 1000

## 2017-05-30 MED ORDER — AMMONIA AROMATIC IN INHA
RESPIRATORY_TRACT | Status: AC
  Filled 2017-05-30: qty 10

## 2017-05-30 MED ORDER — DIBUCAINE 1 % RE OINT: 1.0000 "application " | TOPICAL_OINTMENT | RECTAL | Status: DC | PRN

## 2017-05-30 MED ORDER — BENZOCAINE-MENTHOL 20-0.5 % EX AERO
1.0000 "application " | INHALATION_SPRAY | CUTANEOUS | Status: DC | PRN
  Administered 2017-05-30: 1 via TOPICAL
  Filled 2017-05-30: qty 56

## 2017-05-30 MED ORDER — PRENATAL MULTIVITAMIN CH
1.0000 | ORAL_TABLET | Freq: Every day | ORAL | Status: DC
  Administered 2017-05-31: 1 via ORAL
  Filled 2017-05-30: qty 1

## 2017-05-30 MED ORDER — MISOPROSTOL 200 MCG PO TABS
ORAL_TABLET | ORAL | Status: AC
  Administered 2017-05-30: 800 ug
  Filled 2017-05-30: qty 4

## 2017-05-30 MED ORDER — SIMETHICONE 80 MG PO CHEW: 80.0000 mg | CHEWABLE_TABLET | ORAL | Status: DC | PRN

## 2017-05-30 MED ORDER — COCONUT OIL OIL
1.0000 "application " | TOPICAL_OIL | Status: DC | PRN
  Administered 2017-05-31: 1 via TOPICAL
  Filled 2017-05-30: qty 120

## 2017-05-30 MED ORDER — BUTORPHANOL TARTRATE 1 MG/ML IJ SOLN
1.0000 mg | INTRAMUSCULAR | Status: DC | PRN
  Administered 2017-05-30: 1 mg via INTRAVENOUS
  Filled 2017-05-30: qty 1

## 2017-05-30 MED ORDER — LIDOCAINE HCL (PF) 1 % IJ SOLN
INTRAMUSCULAR | Status: AC
  Filled 2017-05-30: qty 30

## 2017-05-30 MED ORDER — ONDANSETRON HCL 4 MG/2ML IJ SOLN: 4.0000 mg | INTRAMUSCULAR | Status: DC | PRN

## 2017-05-30 MED ORDER — OXYTOCIN 10 UNIT/ML IJ SOLN
INTRAMUSCULAR | Status: AC
  Filled 2017-05-30: qty 2

## 2017-05-30 MED ORDER — OXYCODONE HCL 5 MG PO TABS: 5.0000 mg | ORAL_TABLET | Freq: Four times a day (QID) | ORAL | Status: DC | PRN

## 2017-05-30 MED ORDER — ONDANSETRON HCL 4 MG/2ML IJ SOLN: 4.0000 mg | Freq: Four times a day (QID) | INTRAMUSCULAR | Status: DC | PRN

## 2017-05-30 NOTE — H&P (Signed)
OB History & Physical   History of Present Illness:  Chief Complaint: contractions and fluid leaking  HPI:  Jenna ChiquitoHarleigh Abboud is a 22 y.o. G1P0 female at 4982w2d dated by 18 week ultrasound.  Her pregnancy has been complicated by late entry to care, anemia.    She reports contractions since last night.   She admits wetness but denies gush of fluid.   She denies vaginal bleeding.   She reports fetal movement.    Maternal Medical History:   Past Medical History:  Diagnosis Date  . Medical history non-contributory     No past surgical history on file.  No Known Allergies  Prior to Admission medications   Medication Sig Start Date End Date Taking? Authorizing Provider  ferrous sulfate (FERROUSUL) 325 (65 FE) MG tablet Take 1 tablet (325 mg total) by mouth 2 (two) times daily. 05/12/17   Schuman, Jaquelyn Bitterhristanna R, MD  Prenatal Vit-Fe Fumarate-FA (MULTIVITAMIN-PRENATAL) 27-0.8 MG TABS tablet Take 1 tablet by mouth daily at 12 noon.    [provider]    OB History  Gravida Para Term Preterm AB Living  1            SAB TAB Ectopic Multiple Live Births               # Outcome Date GA Lbr Len/2nd Weight Sex Delivery Anes PTL Lv  1 Current             Prenatal care site: [facility]  Social History: She  reports that she has never smoked. She has never used smokeless tobacco. She reports that she does not drink alcohol or use drugs.  Family History: maternal first cousin: asthma, maternal grandmother: depression, ovarian cancer: mother, maternal aunt: one set of twins, sister: infertility  Review of Systems: Negative x 10 systems reviewed except as noted in the HPI.    Physical Exam:  Vital Signs: BP 132/83 (BP Location: Right Arm)   Pulse 96   Temp 98.4 F (36.9 C)   LMP 09/16/2016 (Within Days)  Constitutional: Well nourished, well developed female in no acute distress.  HEENT: normal Skin: Warm and dry.  Cardiovascular: Regular rate and rhythm.   Extremity: 2+  reflexes  Respiratory: Clear to auscultation bilateral. Normal respiratory effort Abdomen: FHT present Back: no CVAT Neuro: DTRs 2+, Cranial nerves grossly intact Psych: Alert and Oriented x3. No memory deficits. Normal mood and affect.  MS: normal gait, normal bilateral lower extremity ROM/strength/stability.  Pelvic exam:  Cervix per RN exam: 5/100/+2/bulging bag of water Nitrazine negative   Pertinent Results:  Prenatal Labs: Blood type/Rh O positive  Antibody screen negative  Rubella Immune  Varicella Not immune    RPR Non-reactive  HBsAg negative  HIV negative  GC negative  Chlamydia negative  Genetic screening Quad screen negative  1 hour GTT 100  3 hour GTT NA  GBS negative on 3/20   Baseline FHR: 140 beats/min   Variability: moderate   Accelerations: present   Decelerations: absent Contractions: present frequency: every 2-4 minutes Overall assessment: Category I    Assessment:  Jenna Marks is a 22 y.o. G1P0 female at 8782w2d with active labor.   Plan:  1. Admit to Labor & Delivery  2. CBC, T&S, Clrs, IVF 3. GBS negative.   4. Fetal well-being: Category I 5. Expectant management for SVD   Tresea MallJane Djibril Glogowski, [facility] 05/30/2017 12:53 AM

## 2017-05-30 NOTE — Discharge Summary (Signed)
Physician Obstetric Discharge Summary  Patient ID: Jenna ChiquitoHarleigh Marks MRN: [id number] DOB/AGE: Jul 18, 1995 22 y.o.   Date of Admission: 05/29/2017  Date of Discharge: 06/01/2017  Admitting Diagnosis: Onset of Labor at 4676w2d  Secondary Diagnosis: Anemia in pregnancy  Mode of Delivery: normal spontaneous vaginal delivery 05/30/2017       Discharge Diagnosis: Term intrauterine pregnancy-delivered   Intrapartum Procedures: Atificial rupture of membranes and repair of vaginal laceration   Post partum procedures: None  Complications: none   Brief Hospital Course  Jenna Marks is a G1P1001 who had a SVD on 05/30/2017; for further details of this delivery, please refer to the delivery note.  Patient had an uncomplicated postpartum course.  By time of discharge on PPD#2, her pain was controlled on oral pain medications; she had appropriate lochia and was ambulating, voiding without difficulty and tolerating a regular diet.  She was deemed stable for discharge to home.    Labs: CBC Latest Ref Rng & Units 05/31/2017 05/30/2017 03/26/2017  WBC 3.6 - 11.0 K/uL 12.4(H) 14.4(H) 10.5  Hemoglobin 12.0 - 16.0 g/dL 4.0(J9.4(L) 10.7(L) 11.1  Hematocrit 35.0 - 47.0 % 27.9(L) 31.5(L) 32.7(L)  Platelets 150 - 440 K/uL 242 274 221   O POS/ RI/ VNI  Physical exam:  Blood pressure 140/82, pulse 85, temperature 97.9 F (36.6 C), temperature source Oral, resp. rate 18, height 5\' 4"  (1.626 m), weight 77.6 kg (171 lb), last menstrual period 09/16/2016, SpO2 99 %, currently breastfeeding. General: alert and no distress Lochia: appropriate Abdomen: soft, NT Uterine Fundus: firm Extremities: No evidence of DVT seen on physical exam. No lower extremity edema.  Discharge Instructions: Per After Visit Summary. Activity: Advance as tolerated. Pelvic rest for 6 weeks.  Also refer to Discharge Instructions Diet: Regular Medications: Allergies as of 06/01/2017   No Known Allergies     Medication List    TAKE  these medications   ferrous sulfate 325 (65 FE) MG tablet Commonly known as:  FERROUSUL Take 1 tablet (325 mg total) by mouth 2 (two) times daily.   medroxyPROGESTERone 150 MG/ML injection Commonly known as:  DEPO-PROVERA Inject 1 mL (150 mg total) into the muscle every 3 (three) months.   multivitamin-prenatal 27-0.8 MG Tabs tablet Take 1 tablet by mouth daily at 12 noon.      Outpatient follow up:  Follow-up Information    Farrel ConnersGutierrez, Colleen, CNM. Schedule an appointment as soon as possible for a visit today.   Specialty:  Certified Nurse Midwife Why:  for 6 week postpartum check up Contact information: [address] [place] [address] [phone]          Postpartum contraception: Depo-Provera  Discharged Condition: good  Discharged to: home  Newborn Data: Charles Disposition: home with mother  Apgars: APGAR (1 MIN): 8   APGAR (5 MINS): 9    Baby Feeding: Breast  Oswaldo ConroyJacelyn Y Kamirah Shugrue, CNM 06/01/2017 9:12 AM

## 2017-05-30 NOTE — Discharge Instructions (Signed)
°Vaginal Delivery, Care After °Refer to this sheet in the next few weeks. These discharge instructions provide you with information on caring for yourself after delivery. Your caregiver may also give you specific instructions. Your treatment has been planned according to the most current medical practices available, but problems sometimes occur. Call your caregiver if you have any problems or questions after you go home. °HOME CARE INSTRUCTIONS °1. Take over-the-counter or prescription medicines only as directed by your caregiver or pharmacist. °2. Do not drink alcohol, especially if you are breastfeeding or taking medicine to relieve pain. °3. Do not smoke tobacco. °4. Continue to use good perineal care. Good perineal care includes: °1. Wiping your perineum from back to front °2. Keeping your perineum clean. °3. You can do sitz baths twice a day, to help keep this area clean °5. Do not use tampons, douche or have sex for 6 weeks °6. Shower only and avoid sitting in submerged water, aside from sitz baths °7. Wear a well-fitting bra that provides breast support. °8. Eat healthy foods. °9. Drink enough fluids to keep your urine clear or pale yellow. °10. Eat high-fiber foods such as whole grain cereals and breads, brown rice, beans, and fresh fruits and vegetables every day. These foods may help prevent or relieve constipation. °11. Avoid constipation with high fiber foods or medications, such as miralax or metamucil °12. Follow your caregiver's recommendations regarding resumption of activities such as climbing stairs, driving, lifting, exercising, or traveling. °13. Talk to your caregiver about resuming sexual activities. Resumption of sexual activities after 6 weeks is dependent upon your risk of infection, your rate of healing, and your comfort and desire to resume sexual activity. °14. Try to have someone help you with your household activities and your newborn for at least a few days after you leave the  hospital. °15. Rest as much as possible. Try to rest or take a nap when your newborn is sleeping. °16. Increase your activities gradually. °17. Keep all of your scheduled postpartum appointments. It is very important to keep your scheduled follow-up appointments. At these appointments, your caregiver will be checking to make sure that you are healing physically and emotionally. °SEEK MEDICAL CARE IF:  °· You are passing large clots from your vagina. Save any clots to show your caregiver. °· You have a foul smelling discharge from your vagina. °· You have trouble urinating. °· You are urinating frequently. °· You have pain when you urinate. °· You have a change in your bowel movements. °· You have increasing redness, pain, or swelling near your vaginal incision (episiotomy) or vaginal tear. °· You have pus draining from your episiotomy or vaginal tear. °· Your episiotomy or vaginal tear is separating. °· You have painful, hard, or reddened breasts. °· You have a severe headache. °· You have blurred vision or see spots. °· You feel sad or depressed. °· You have thoughts of hurting yourself or your newborn. °· You have questions about your care, the care of your newborn, or medicines. °· You are dizzy or light-headed. °· You have a rash. °· You have nausea or vomiting. °· You were breastfeeding and have not had a menstrual period within 12 weeks after you stopped breastfeeding. °· You are not breastfeeding and have not had a menstrual period by the 12th week after delivery. °· You have a fever of 100.5 or more °SEEK IMMEDIATE MEDICAL CARE IF:  °· You have persistent pain. °· You have chest pain. °· You have shortness   of breath.  You faint.  You have leg pain.  You have stomach pain.  Your vaginal bleeding saturates two or more sanitary pads in 1 hour. MAKE SURE YOU:   Understand these instructions.  Will watch your condition.  Will get help right away if you are not doing well or get worse. Document  Released: 02/14/2000 Document Revised: 07/03/2013 Document Reviewed: 10/14/2011 [facility] Patient Information 2015 [place], [place]. This information is not intended to replace advice given to you by your health care provider. Make sure you discuss any questions you have with your health care provider.  Sitz Bath A sitz bath is a warm water bath taken in the sitting position. The water covers only the hips and butt (buttocks). We recommend using one that fits in the toilet, to help with ease of use and cleanliness. It may be used for either healing or cleaning purposes. Sitz baths are also used to relieve pain, itching, or muscle tightening (spasms). The water may contain medicine. Moist heat will help you heal and relax.  HOME CARE  Take 3 to 4 sitz baths a day. 18. Fill the bathtub half-full with warm water. 19. Sit in the water and open the drain a little. 20. Turn on the warm water to keep the tub half-full. Keep the water running constantly. 21. Soak in the water for 15 to 20 minutes. 22. After the sitz bath, pat the affected area dry. GET HELP RIGHT AWAY IF: You get worse instead of better. Stop the sitz baths if you get worse. MAKE SURE YOU:  Understand these instructions.  Will watch your condition.  Will get help right away if you are not doing well or get worse. Document Released: 03/26/2004 Document Revised: 11/11/2011 Document Reviewed: 06/16/2010 [facility] Patient Information 2015 [place], [place]. This information is not intended to replace advice given to you by your health care provider. Make sure you discuss any questions you have with your health care provider.   Breastfeeding Choosing to breastfeed is one of the best decisions you can make for yourself and your baby. A change in hormones during pregnancy causes your breasts to make breast milk in your milk-producing glands. Hormones prevent breast milk from being released before your baby is born. They also prompt milk flow  after birth. Once breastfeeding has begun, thoughts of your baby, as well as his or her sucking or crying, can stimulate the release of milk from your milk-producing glands. Benefits of breastfeeding Research shows that breastfeeding offers many health benefits for infants and mothers. It also offers a cost-free and convenient way to feed your baby. For your baby  Your first milk (colostrum) helps your baby's digestive system to function better.  Special cells in your milk (antibodies) help your baby to fight off infections.  Breastfed babies are less likely to develop asthma, allergies, obesity, or type 2 diabetes. They are also at lower risk for sudden infant death syndrome (SIDS).  Nutrients in breast milk are better able to meet your babys needs compared to infant formula.  Breast milk improves your baby's brain development. For you  Breastfeeding helps to create a very special bond between you and your baby.  Breastfeeding is convenient. Breast milk costs nothing and is always available at the correct temperature.  Breastfeeding helps to burn calories. It helps you to lose the weight that you gained during pregnancy.  Breastfeeding makes your uterus return faster to its size before pregnancy. It also slows bleeding (lochia) after you give birth.  Breastfeeding helps to lower your risk of developing type 2 diabetes, osteoporosis, rheumatoid arthritis, cardiovascular disease, and breast, ovarian, uterine, and endometrial cancer later in life. °Breastfeeding basics °Starting breastfeeding °· Find a comfortable place to sit or lie down, with your neck and back well-supported. °· Place a pillow or a rolled-up blanket under your baby to bring him or her to the level of your breast (if you are seated). Nursing pillows are specially designed to help support your arms and your baby while you breastfeed. °· Make sure that your baby's tummy (abdomen) is facing your abdomen. °· Gently massage your  breast. With your fingertips, massage from the outer edges of your breast inward toward the nipple. This encourages milk flow. If your milk flows slowly, you may need to continue this action during the feeding. °· Support your breast with 4 fingers underneath and your thumb above your nipple (make the letter "C" with your hand). Make sure your fingers are well away from your nipple and your baby’s mouth. °· Stroke your baby's lips gently with your finger or nipple. °· When your baby's mouth is open wide enough, quickly bring your baby to your breast, placing your entire nipple and as much of the areola as possible into your baby's mouth. The areola is the colored area around your nipple. °? More areola should be visible above your baby's upper lip than below the lower lip. °? Your baby's lips should be opened and extended outward (flanged) to ensure an adequate, comfortable latch. °? Your baby's tongue should be between his or her lower gum and your breast. °· Make sure that your baby's mouth is correctly positioned around your nipple (latched). Your baby's lips should create a seal on your breast and be turned out (everted). °· It is common for your baby to suck about 2-3 minutes in order to start the flow of breast milk. °Latching °Teaching your baby how to latch onto your breast properly is very important. An improper latch can cause nipple pain, decreased milk supply, and poor weight gain in your baby. Also, if your baby is not latched onto your nipple properly, he or she may swallow some air during feeding. This can make your baby fussy. Burping your baby when you switch breasts during the feeding can help to get rid of the air. However, teaching your baby to latch on properly is still the best way to prevent fussiness from swallowing air while breastfeeding. °Signs that your baby has successfully latched onto your nipple °· Silent tugging or silent sucking, without causing you pain. Infant's lips should be  extended outward (flanged). °· Swallowing heard between every 3-4 sucks once your milk has started to flow (after your let-down milk reflex occurs). °· Muscle movement above and in front of his or her ears while sucking. ° °Signs that your baby has not successfully latched onto your nipple °· Sucking sounds or smacking sounds from your baby while breastfeeding. °· Nipple pain. ° °If you think your baby has not latched on correctly, slip your finger into the corner of your baby’s mouth to break the suction and place it between your baby's gums. Attempt to start breastfeeding again. °Signs of successful breastfeeding °Signs from your baby °· Your baby will gradually decrease the number of sucks or will completely stop sucking. °· Your baby will fall asleep. °· Your baby's body will relax. °· Your baby will retain a small amount of milk in his or her mouth. °· Your baby will   let go of your breast by himself or herself.  Signs from you  Breasts that have increased in firmness, weight, and size 1-3 hours after feeding.  Breasts that are softer immediately after breastfeeding.  Increased milk volume, as well as a change in milk consistency and color by the fifth day of breastfeeding.  Nipples that are not sore, cracked, or bleeding.  Signs that your baby is getting enough milk  Wetting at least 1-2 diapers during the first 24 hours after birth.  Wetting at least 5-6 diapers every 24 hours for the first week after birth. The urine should be clear or pale yellow by the age of 5 days.  Wetting 6-8 diapers every 24 hours as your baby continues to grow and develop.  At least 3 stools in a 24-hour period by the age of 5 days. The stool should be soft and yellow.  At least 3 stools in a 24-hour period by the age of 7 days. The stool should be seedy and yellow.  No loss of weight greater than 10% of birth weight during the first 3 days of life.  Average weight gain of 4-7 oz (113-198 g) per week after  the age of 4 days.  Consistent daily weight gain by the age of 5 days, without weight loss after the age of 2 weeks. After a feeding, your baby may spit up a small amount of milk. This is normal. Breastfeeding frequency and duration Frequent feeding will help you make more milk and can prevent sore nipples and extremely full breasts (breast engorgement). Breastfeed when you feel the need to reduce the fullness of your breasts or when your baby shows signs of hunger. This is called "breastfeeding on demand." Signs that your baby is hungry include:  Increased alertness, activity, or restlessness.  Movement of the head from side to side.  Opening of the mouth when the corner of the mouth or cheek is stroked (rooting).  Increased sucking sounds, smacking lips, cooing, sighing, or squeaking.  Hand-to-mouth movements and sucking on fingers or hands.  Fussing or crying.  Avoid introducing a pacifier to your baby in the first 4-6 weeks after your baby is born. After this time, you may choose to use a pacifier. Research has shown that pacifier use during the first year of a baby's life decreases the risk of sudden infant death syndrome (SIDS). Allow your baby to feed on each breast as long as he or she wants. When your baby unlatches or falls asleep while feeding from the first breast, offer the second breast. Because newborns are often sleepy in the first few weeks of life, you may need to awaken your baby to get him or her to feed. Breastfeeding times will vary from baby to baby. However, the following rules can serve as a guide to help you make sure that your baby is properly fed:  Newborns (babies 594 weeks of age or younger) may breastfeed every 1-3 hours.  Newborns should not go without breastfeeding for longer than 3 hours during the day or 5 hours during the night.  You should breastfeed your baby a minimum of 8 times in a 24-hour period.  Breast milk pumping Pumping and storing breast  milk allows you to make sure that your baby is exclusively fed your breast milk, even at times when you are unable to breastfeed. This is especially important if you go back to work while you are still breastfeeding, or if you are not able to be  present during feedings. Your lactation consultant can help you find a method of pumping that works best for you and give you guidelines about how long it is safe to store breast milk. Caring for your breasts while you breastfeed Nipples can become dry, cracked, and sore while breastfeeding. The following recommendations can help keep your breasts moisturized and healthy:  Avoid using soap on your nipples.  Wear a supportive bra designed especially for nursing. Avoid wearing underwire-style bras or extremely tight bras (sports bras).  Air-dry your nipples for 3-4 minutes after each feeding.  Use only cotton bra pads to absorb leaked breast milk. Leaking of breast milk between feedings is normal.  Use lanolin on your nipples after breastfeeding. Lanolin helps to maintain your skin's normal moisture barrier. Pure lanolin is not harmful (not toxic) to your baby. You may also hand express a few drops of breast milk and gently massage that milk into your nipples and allow the milk to air-dry.  In the first few weeks after giving birth, some women experience breast engorgement. Engorgement can make your breasts feel heavy, warm, and tender to the touch. Engorgement peaks within 3-5 days after you give birth. The following recommendations can help to ease engorgement:  Completely empty your breasts while breastfeeding or pumping. You may want to start by applying warm, moist heat (in the shower or with warm, water-soaked hand towels) just before feeding or pumping. This increases circulation and helps the milk flow. If your baby does not completely empty your breasts while breastfeeding, pump any extra milk after he or she is finished.  Apply ice packs to your  breasts immediately after breastfeeding or pumping, unless this is too uncomfortable for you. To do this: ? Put ice in a plastic bag. ? Place a towel between your skin and the bag. ? Leave the ice on for 20 minutes, 2-3 times a day.  Make sure that your baby is latched on and positioned properly while breastfeeding.  If engorgement persists after 48 hours of following these recommendations, contact your health care provider or a Advertising copywriter. Overall health care recommendations while breastfeeding  Eat 3 healthy meals and 3 snacks every day. Well-nourished mothers who are breastfeeding need an additional 450-500 calories a day. You can meet this requirement by increasing the amount of a balanced diet that you eat.  Drink enough water to keep your urine pale yellow or clear.  Rest often, relax, and continue to take your prenatal vitamins to prevent fatigue, stress, and low vitamin and mineral levels in your body (nutrient deficiencies).  Do not use any products that contain nicotine or tobacco, such as cigarettes and e-cigarettes. Your baby may be harmed by chemicals from cigarettes that pass into breast milk and exposure to secondhand smoke. If you need help quitting, ask your health care provider.  Avoid alcohol.  Do not use illegal drugs or marijuana.  Talk with your health care provider before taking any medicines. These include over-the-counter and prescription medicines as well as vitamins and herbal supplements. Some medicines that may be harmful to your baby can pass through breast milk.  It is possible to become pregnant while breastfeeding. If birth control is desired, ask your health care provider about options that will be safe while breastfeeding your baby. Where to find more information: [employer]: www.llli.org Contact a health care provider if:  You feel like you want to stop breastfeeding or have become frustrated with breastfeeding.  Your  nipples are  cracked or bleeding.  Your breasts are red, tender, or warm.  You have: ? Painful breasts or nipples. ? A swollen area on either breast. ? A fever or chills. ? Nausea or vomiting. ? Drainage other than breast milk from your nipples.  Your breasts do not become full before feedings by the fifth day after you give birth.  You feel sad and depressed.  Your baby is: ? Too sleepy to eat well. ? Having trouble sleeping. ? More than 44 week old and wetting fewer than 6 diapers in a 24-hour period. ? Not gaining weight by 6 days of age.  Your baby has fewer than 3 stools in a 24-hour period.  Your baby's skin or the white parts of his or her eyes become yellow. Get help right away if:  Your baby is overly tired (lethargic) and does not want to wake up and feed.  Your baby develops an unexplained fever. Summary  Breastfeeding offers many health benefits for infant and mothers.  Try to breastfeed your infant when he or she shows early signs of hunger.  Gently tickle or stroke your baby's lips with your finger or nipple to allow the baby to open his or her mouth. Bring the baby to your breast. Make sure that much of the areola is in your baby's mouth. Offer one side and burp the baby before you offer the other side.  Talk with your health care provider or lactation consultant if you have questions or you face problems as you breastfeed. This information is not intended to replace advice given to you by your health care provider. Make sure you discuss any questions you have with your health care provider. Document Released: 02/16/2005 Document Revised: 03/20/2016 Document Reviewed: 03/20/2016 Elsevier Interactive Patient Education  2018 [employer].  Breast Pumping Tips If you are breastfeeding, there may be times when you cannot feed your baby directly. Returning to work or going on a trip are examples. Pumping allows you to store breast milk and feed it to your baby  later. You may not get much milk when you first start to pump. Your breasts should start to make more after a few days. If you pump at the times you usually feed your baby, you may be able to keep making enough milk to feed your baby without also using formula. The more often you pump, the more milk your body will make. When should I pump?  You can start to pump soon after you have your baby. Ask your doctor what is right for you and your baby.  If you are going back to work, start pumping a few weeks before. This gives you time to learn how to pump and to store a supply of milk.  When you are with your baby, feed your baby when he or she is hungry. Pump after each feeding.  When you are away from your baby for many hours, pump for about 15 minutes every 2-3 hours. Pump both breasts at the same time if you can.  If your baby has a formula feeding, make sure to pump close to the same time.  If you drink any alcohol, wait 2 hours before pumping. How do I get ready to pump? Your let-down reflex is your body's natural reaction that makes your breast milk flow. It is easier to make your breast milk flow when you are relaxed. Try these things to help you relax:  Smell one of your baby's blankets or an item  of clothing.  Look at a picture or video of your baby.  Sit in a quiet, private space.  Massage the breast you plan to pump.  Place soothing warmth on the breast.  Play relaxing music.  What are some breast pumping tips?  Wash your hands before you pump. You do not need to wash your nipples or breasts.  There are three ways to pump. You can: ? Use your hand to massage and squeeze your breast. ? Use a handheld manual pump. ? Use an electric pump.  Make sure the suction cup on the breast pump is the right size. Place the suction cup directly over the nipple. It can be painful or hurt your nipple if it is the wrong size or placed wrong.  Put a small amount of purified or modified  lanolin on your nipple and areola if you are sore.  If you are using an electric pump, change the speed and suction power to be more comfortable.  You may need a different type of pump if pumping hurts or you do not get a lot of milk. Your doctor can help you pick what type of pump to use.  Keep a full water bottle near you always. Drinking lots of fluid helps you make more milk.  You can store your milk to use later. Pumped breast milk can be stored in a sealable, sterile container or plastic bag. Always put the date you pumped it on the container. ? Milk can stay out at room temperature for up to 8 hours. ? You can store your milk in the refrigerator for up to 8 days. ? You can store your milk in the freezer for 3 months. Thaw frozen milk using warm water. Do not put it in the microwave.  Do not smoke. Ask your doctor for help. When should I call my doctor?  You have a hard time pumping.  You are worried you do not make enough milk.  You have nipple pain, soreness, or redness.  You want to take birth control pills. This information is not intended to replace advice given to you by your health care provider. Make sure you discuss any questions you have with your health care provider. Document Released: 08/05/2007 Document Revised: 07/25/2015 Document Reviewed: 12/09/2012 Elsevier Interactive Patient Education  2017 [employer]

## 2017-05-30 NOTE — Progress Notes (Addendum)
  Labor Progress Note   22 y.o. G1P0 @ 4724w2d , admitted for  Pregnancy, Labor Management.   Subjective:  Patient is coping well with contractions  Objective:  BP 135/78   Pulse 95   Temp 98 F (36.7 C) (Oral)   Resp 18   Ht 5\' 4"  (1.626 m)   Wt 171 lb (77.6 kg)   LMP 09/16/2016 (Within Days)   BMI 29.35 kg/m  Abd: mild Extr: trace to 1+ bilateral pedal edema SVE: CERVIX: 7 cm dilated, 100 effaced, +2 station  EFM: FHR: 125 bpm, variability: moderate,  accelerations:  Present,  decelerations:  Absent Toco: Frequency: Every 3-4 minutes Labs: I have reviewed the patient's lab results.   Assessment & Plan:  G1P0 @ 424w2d, admitted for  Pregnancy and Labor/Delivery Management  1. Pain management: s/p 1 dose of stadol at 2:30 AM. 2. FWB: FHT category I 3. ID: [id number] negative 4. Labor management: expectant management  All discussed with patient, see orders   Jenna Marks, CNM [facility], [facility] 05/30/2017  7:46 AM

## 2017-05-30 NOTE — Progress Notes (Signed)
Labor Progress Note   S: Using nitrous oxide for pain relief.  O: Temp:  [98 F (36.7 C)-98.4 F (36.9 C)] 98 F (36.7 C) (03/31 0954) Pulse Rate:  [89-100] 89 (03/31 0954) Resp:  [18] 18 (03/31 0954) BP: (127-146)/(71-88) 146/88 (03/31 0954) SpO2:  [96 %-99 %] 99 % (03/31 0950) Weight:  [77.6 kg (171 lb)] 77.6 kg (171 lb) (03/31 0203)   General: anxious, but tolerating contractions with nitrous oxide and breatning FHR: 125-130 with accelerations to 150s to 160s, moderate variability Toco: contractions every 4-7 min apart lasting > 60 sec Cervix: 6-7/90%/+1 (was 6 cm at 0230)  AROM: small amount clear fluid  A: IUP at 38wk2d with protracted labor FWB: Cat 1 tracing  P: Consider Pitocin if contractions do not increase in intensity/ frequency after AROM or if no progress.  Farrel Connersolleen Suanne Minahan, CNM

## 2017-05-31 LAB — CBC
HCT: 27.9 % — ABNORMAL LOW (ref 35.0–47.0)
Hemoglobin: 9.4 g/dL — ABNORMAL LOW (ref 12.0–16.0)
MCH: 27.9 pg (ref 26.0–34.0)
MCHC: 33.7 g/dL (ref 32.0–36.0)
MCV: 83 fL (ref 80.0–100.0)
PLATELETS: 242 10*3/uL (ref 150–440)
RBC: 3.36 MIL/uL — AB (ref 3.80–5.20)
RDW: 14.3 % (ref 11.5–14.5)
WBC: 12.4 10*3/uL — ABNORMAL HIGH (ref 3.6–11.0)

## 2017-05-31 NOTE — Lactation Note (Signed)
This note was copied from a baby's chart. Lactation Consultation Note  Patient Name: Jenna Marks RJJOA'CToday's Date: 05/31/2017 Reason for consult: Follow-up assessment   Maternal Data Has patient been taught Hand Expression?: Yes Does the patient have breastfeeding experience prior to this delivery?: No  Feeding Feeding Type: Breast Fed Length of feed: 20 min  LATCH Score Latch: (mother latched baby w/o LC assistance)  Audible Swallowing: A few with stimulation  Type of Nipple: Everted at rest and after stimulation  Comfort (Breast/Nipple): Soft / non-tender  Hold (Positioning): Assistance needed to correctly position infant at breast and maintain latch.  LATCH Score: 7  Interventions Interventions: Assisted with latch;Breast feeding basics reviewed  Lactation Tools Discussed/Used     Consult Status Consult Status: Follow-up Date: 06/01/17 Follow-up type: In-patient  PT needs assistance with right side. Nipples are flat, but come out with reverse pressure and rolling. Baby's bottom lip curls in and needs to be pulled down at times.   Jenna Marks 05/31/2017, 10:04 PM

## 2017-05-31 NOTE — Progress Notes (Signed)
Post Partum Day 1 Subjective: Doing well, no complaints.  Tolerating regular diet, pain with PO meds, voiding and ambulating without difficulty.  No CP SOB F/C N/V or leg pain No HA, change of vision, RUQ/epigastric pain  Objective: BP 121/65 (BP Location: Left Arm)   Pulse 98   Temp 98.6 F (37 C) (Oral)   Resp 18   Ht 5\' 4"  (1.626 m)   Wt 171 lb (77.6 kg)   LMP 09/16/2016 (Within Days)   SpO2 98%   Breastfeeding   BMI 29.35 kg/m    Physical Exam:  General: NAD CV: RRR Pulm: nl effort, CTABL Lochia: moderate Uterine Fundus: fundus firm and below umbilicus DVT Evaluation: no cords, ttp LEs   Recent Labs    05/30/17 0119 05/31/17 0500  HGB 10.7* 9.4*  HCT 31.5* 27.9*  WBC 14.4* 12.4*  PLT 274 242    Assessment/Plan: 22 y.o. G1P1001 postpartum day # 1  1. Continue routine postpartum care 2. O positive, Rubella Immune, Varicella Non-immune- offer Varivax 3. TDAP given antepartum 4. Breast/Contraception: undecided 5. Disposition: discharge to home   Tresea MallJane Rease Wence, CNM [facility] [place]

## 2017-06-01 LAB — RPR: RPR Ser Ql: NONREACTIVE

## 2017-06-01 MED ORDER — MEDROXYPROGESTERONE ACETATE 150 MG/ML IM SUSP: 150.0000 mg | INTRAMUSCULAR | 3 refills | Status: DC

## 2017-06-01 MED ORDER — MEDROXYPROGESTERONE ACETATE 150 MG/ML IM SUSP
150.0000 mg | Freq: Once | INTRAMUSCULAR | Status: AC
  Administered 2017-06-01: 150 mg via INTRAMUSCULAR
  Filled 2017-06-01: qty 1

## 2017-06-01 NOTE — Progress Notes (Signed)
Discharge instructions given. Patient verbalizes understanding of teaching. Patient discharged home via wheelchair at 10:10.

## 2017-06-02 ENCOUNTER — Encounter: Payer: BLUE CROSS/BLUE SHIELD | Admitting: Certified Nurse Midwife

## 2017-07-12 ENCOUNTER — Ambulatory Visit: Payer: BLUE CROSS/BLUE SHIELD | Admitting: Certified Nurse Midwife

## 2017-08-03 ENCOUNTER — Ambulatory Visit (INDEPENDENT_AMBULATORY_CARE_PROVIDER_SITE_OTHER): Payer: BLUE CROSS/BLUE SHIELD | Admitting: Certified Nurse Midwife

## 2017-08-03 ENCOUNTER — Encounter: Payer: Self-pay | Admitting: Certified Nurse Midwife

## 2017-08-03 NOTE — Progress Notes (Signed)
Postpartum Visit  Chief Complaint:  Chief Complaint  Patient presents with  . Postpartum Care    6 wk pp    History of Present Illness: Jenna Marks is a 22 y.o. G1P1001 who presents for her 6 week postpartum visit.  Date of delivery: 05/30/2017 Type of delivery: Vaginal delivery - Vacuum or forceps assisted  no Episiotomy No.  Laceration: yes, vaginal Pregnancy or labor problems:  Yes, late entry to care, anemia, uterine atony with delivery Any problems since the delivery:  no  Newborn Details:  SINGLETON :  1. Baby's name: Leonette Most. Birth weight: 7#4oz Maternal Details:  Breast Feeding:  no Post partum depression/anxiety noted:  no [place] Post-Partum Depression Score:  3  Date of last PAP: NA due to age    Review of Systems: Review of Systems  Constitutional: Negative for chills, fever and weight loss.  HENT: Negative for congestion, sinus pain and sore throat.   Eyes: Negative for blurred vision and pain.  Respiratory: Negative for hemoptysis, shortness of breath and wheezing.   Cardiovascular: Negative for chest pain, palpitations and leg swelling.  Gastrointestinal: Negative for abdominal pain, blood in stool, diarrhea, heartburn, nausea and vomiting.  Genitourinary: Negative for dysuria, frequency, hematuria and urgency.  Musculoskeletal: Negative for back pain, joint pain and myalgias.  Skin: Negative for itching and rash.  Neurological: Negative for dizziness, tingling and headaches.  Endo/Heme/Allergies: Negative for environmental allergies and polydipsia. Does not bruise/bleed easily.       Negative for hirsutism   Psychiatric/Behavioral: Negative for depression. The patient is not nervous/anxious and does not have insomnia.     Past Medical History:  Past Medical History:  Diagnosis Date  . Medical history non-contributory     Past Surgical History:  History reviewed. No pertinent surgical history.  Family History:  Family History    Problem Relation Age of Onset  . Cancer Neg Hx   . Diabetes Neg Hx   . Heart failure Neg Hx   . Stroke Neg Hx   . Thyroid disease Neg Hx     Social History:  Social History   Socioeconomic History  . Marital status: Single    Spouse name: Not on file  . Number of children: 1  . Years of education: Not on file  . Highest education level: Not on file  Occupational History  . Not on file  Social Needs  . Financial resource strain: Not on file  . Food insecurity:    Worry: Not on file    Inability: Not on file  . Transportation needs:    Medical: Not on file    Non-medical: Not on file  Tobacco Use  . Smoking status: Never Smoker  . Smokeless tobacco: Never Used  Substance and Sexual Activity  . Alcohol use: No  . Drug use: No  . Sexual activity: Yes    Birth control/protection: Injection  Lifestyle  . Physical activity:    Days per week: Not on file    Minutes per session: Not on file  . Stress: Not on file  Relationships  . Social connections:    Talks on phone: Not on file    Gets together: Not on file    Attends religious service: Not on file    Active member of club or organization: Not on file    Attends meetings of clubs or organizations: Not on file    Relationship status: Not on file  . Intimate partner violence:  Fear of current or ex partner: Not on file    Emotionally abused: Not on file    Physically abused: Not on file    Forced sexual activity: Not on file  Other Topics Concern  . Not on file  Social History Narrative  . Not on file    Allergies:  No Known Allergies  Medications: Prior to Admission medications   Medication Sig Start Date End Date Taking? Authorizing Provider  medroxyPROGESTERone (DEPO-PROVERA) 150 MG/ML injection Inject 1 mL (150 mg total) into the muscle every 3 (three) months. 06/01/17  Yes Oswaldo ConroySchmid, Jacelyn Y, CNM    Physical Exam Vitals: BP 122/62   Pulse 76   Ht 5\' 4"  (1.626 m)   Wt 156 lb (70.8 kg)    Breastfeeding? No   BMI 26.78 kg/m  General: teen White female in NAD HEENT: normocephalic, anicteric Neck: No thyroid enlargement, no palpable nodules, no cervical lymphadenpathy Breast:  no inflammation, no masses, nipples intact Pulmonary: No increased work of breathing, CTAB Abdomen: Soft, non-tender, non-distended.  Umbilicus without lesions.  No hepatomegaly or masses palpable. No evidence of hernia. Genitourinary:  External: Well healed perineum, no lesions or inflammation    Vagina: Normal vaginal mucosa, no evidence of prolapse.    Cervix:  no bleeding, closed  Uterus: Well involuted, RV,  mobile, non-tender  Adnexa: No adnexal masses, non-tender  Rectal: deferred Extremities: no edema, erythema, or tenderness Neurologic: Grossly intact Psychiatric: mood appropriate, affect full  Assessment: 22 y.o. G1P1001 presenting for 6 week postpartum visit-normal involution  Plan:  1) -desires to continue Depo Provera. Next dose due 24 August 2017   2)  Pap not done due to age  83) Patient underwent screening for postpartum depression with no concerns noted.  4) Discussed return to normal activity. Encouraged kegel exercises  5) Follow up 1 year for routine annual exam.

## 2017-08-24 ENCOUNTER — Ambulatory Visit: Payer: BLUE CROSS/BLUE SHIELD

## 2017-09-05 ENCOUNTER — Emergency Department
Admission: EM | Admit: 2017-09-05 | Discharge: 2017-09-05 | Disposition: A | Discharge status: Home / Self Care | Payer: BLUE CROSS/BLUE SHIELD | Attending: Emergency Medicine | Admitting: Emergency Medicine

## 2017-09-05 ENCOUNTER — Other Ambulatory Visit: Payer: Self-pay

## 2017-09-05 DIAGNOSIS — Z0279 Encounter for issue of other medical certificate: Secondary | ICD-10-CM | POA: Insufficient documentation

## 2017-09-05 DIAGNOSIS — R109 Unspecified abdominal pain: Secondary | ICD-10-CM | POA: Diagnosis not present

## 2017-09-05 DIAGNOSIS — R197 Diarrhea, unspecified: Secondary | ICD-10-CM | POA: Insufficient documentation

## 2017-09-05 DIAGNOSIS — Z7689 Persons encountering health services in other specified circumstances: Secondary | ICD-10-CM

## 2017-09-05 NOTE — ED Provider Notes (Signed)
[facility] Emergency Department Provider Note ____________________________________________  Time seen: 1414  I have reviewed the triage vital signs and the nursing notes.  HISTORY  Chief Complaint  Letter for School/Work  HPI Jenna Marks is a 22 y.o. female presents to the ED for a evaluation of recent acute illness.  Patient is also requesting a return to work note.  She describes she has had several days of diarrhea and had some abdominal pain yesterday.  She describes symptoms resolved without incident but she did not seek care.  She had to call out of work yesterday due to her sudden symptoms.  She also did not return to work today, because her employer is requesting a release to work note.  She works at a [employer].  She denies any current symptoms, fevers, chills, nausea, vomiting, or diarrhea.  Past Medical History:  Diagnosis Date  . Medical history non-contributory     Patient Active Problem List   Diagnosis Date Noted  . Postpartum care following vaginal delivery 05/30/2017  . Back pain 01/22/2017    History reviewed. No pertinent surgical history.  Prior to Admission medications   Medication Sig Start Date End Date Taking? Authorizing Provider  medroxyPROGESTERone (DEPO-PROVERA) 150 MG/ML injection Inject 1 mL (150 mg total) into the muscle every 3 (three) months. 06/01/17   Oswaldo Conroy, CNM    Allergies Patient has no known allergies.  Family History  Problem Relation Age of Onset  . Cancer Neg Hx   . Diabetes Neg Hx   . Heart failure Neg Hx   . Stroke Neg Hx   . Thyroid disease Neg Hx     Social History Social History   Tobacco Use  . Smoking status: Never Smoker  . Smokeless tobacco: Never Used  Substance Use Topics  . Alcohol use: No  . Drug use: No    Review of Systems  Constitutional: Negative for fever. Eyes: Negative for visual changes. ENT: Negative for sore throat. Cardiovascular:  Negative for chest pain. Respiratory: Negative for shortness of breath. Gastrointestinal: Negative for abdominal pain, vomiting and diarrhea. Genitourinary: Negative for dysuria. Musculoskeletal: Negative for back pain. Skin: Negative for rash. Neurological: Negative for headaches, focal weakness or numbness. ____________________________________________  PHYSICAL EXAM:  VITAL SIGNS: ED Triage Vitals  Enc Vitals Group     BP 09/05/17 1248 124/64     Pulse Rate 09/05/17 1248 88     Resp 09/05/17 1248 17     Temp 09/05/17 1248 98.2 F (36.8 C)     Temp Source 09/05/17 1248 Oral     SpO2 09/05/17 1248 100 %     Weight 09/05/17 1249 165 lb (74.8 kg)     Height 09/05/17 1249 5\' 4"  (1.626 m)     Head Circumference --      Peak Flow --      Pain Score 09/05/17 1249 0     Pain Loc --      Pain Edu? --      Excl. in GC? --     Constitutional: Alert and oriented. Well appearing and in no distress. Head: Normocephalic and atraumatic. Eyes: Conjunctivae are normal. PERRL. Normal extraocular movements Ears: Canals clear. TMs intact bilaterally. Nose: No congestion/rhinorrhea/epistaxis. Mouth/Throat: Mucous membranes are moist. Neck: Supple. No thyromegaly. Hematological/Lymphatic/Immunological: No cervical lymphadenopathy. Cardiovascular: Normal rate, regular rhythm. Normal distal pulses. Respiratory: Normal respiratory effort. No wheezes/rales/rhonchi. Gastrointestinal: Soft and nontender. No distention. Musculoskeletal: Nontender with normal range of motion in  all extremities.  Neurologic:  Normal gait without ataxia. Normal speech and language. No gross focal neurologic deficits are appreciated. Skin:  Skin is warm, dry and intact. No rash noted. ____________________________________________  INITIAL IMPRESSION / ASSESSMENT AND PLAN / ED COURSE  Patient with ED evaluation for return to work.  Patient without any acute complaints at this time.  Patient's exam is overall benign.   She will be discharged with a prescription return to work tomorrow as requested.  She is advised to select a primary care provider for routine care. ____________________________________________  FINAL CLINICAL IMPRESSION(S) / ED DIAGNOSES  Final diagnoses:  Return to work evaluation      Karmen StabsMenshew, Charlesetta IvoryJenise V Bacon, PA-C 09/05/17 1706    Dionne BucySiadecki, Sebastian, MD 09/05/17 2057

## 2017-09-05 NOTE — ED Triage Notes (Signed)
Pt states "nothing wrong today" states she had an upset stomach yesterday and states she needs a work note.

## 2017-09-05 NOTE — Discharge Instructions (Addendum)
You have requested evaluation for a return to work note for your provider. Follow-up with a local provider for routine medical care.

## 2017-09-20 ENCOUNTER — Encounter: Payer: Self-pay | Admitting: Podiatry

## 2017-09-20 ENCOUNTER — Ambulatory Visit (INDEPENDENT_AMBULATORY_CARE_PROVIDER_SITE_OTHER): Payer: BLUE CROSS/BLUE SHIELD

## 2017-09-20 ENCOUNTER — Other Ambulatory Visit: Payer: Self-pay | Admitting: Podiatry

## 2017-09-20 ENCOUNTER — Ambulatory Visit (INDEPENDENT_AMBULATORY_CARE_PROVIDER_SITE_OTHER): Payer: BLUE CROSS/BLUE SHIELD | Admitting: Podiatry

## 2017-09-20 DIAGNOSIS — M722 Plantar fascial fibromatosis: Secondary | ICD-10-CM

## 2017-09-20 DIAGNOSIS — M2140 Flat foot [pes planus] (acquired), unspecified foot: Secondary | ICD-10-CM

## 2017-09-20 MED ORDER — MELOXICAM 15 MG PO TABS: 15.0000 mg | ORAL_TABLET | Freq: Every day | ORAL | 3 refills | Status: DC

## 2017-09-20 MED ORDER — METHYLPREDNISOLONE 4 MG PO TBPK: ORAL_TABLET | ORAL | 0 refills | Status: DC

## 2017-09-20 NOTE — Patient Instructions (Signed)

## 2017-09-20 NOTE — Progress Notes (Signed)
  Subjective:  Patient ID: Jenna Marks, female    DOB: [birth year],  MRN: [id number] HPI Chief Complaint  Patient presents with  . Foot Pain    patient presents today with bilat feet/arch pain x 1 month.  She reports stining pains that comes and goes and pain is worse when standing from sitting a while.  She has been using arch supports and soaking in Epson salt with no relief    22 y.o. female presents with the above complaint.   ROS: Denies fever chills nausea vomiting muscle aches pains calf pain back pain chest pain shortness of breath.  Past Medical History:  Diagnosis Date  . Medical history non-contributory    No past surgical history on file.  Current Outpatient Medications:  .  medroxyPROGESTERone (DEPO-PROVERA) 150 MG/ML injection, Inject 1 mL (150 mg total) into the muscle every 3 (three) months., Disp: 1 mL, Rfl: 3 .  meloxicam (MOBIC) 15 MG tablet, Take 1 tablet (15 mg total) by mouth daily., Disp: 30 tablet, Rfl: 3 .  methylPREDNISolone (MEDROL DOSEPAK) 4 MG TBPK tablet, 6 day dose pack - take as directed, Disp: 21 tablet, Rfl: 0  No Known Allergies Review of Systems Objective:  There were no vitals filed for this visit.  General: Well developed, nourished, in no acute distress, alert and oriented x3   Dermatological: Skin is warm, dry and supple bilateral. Nails x 10 are well maintained; remaining integument appears unremarkable at this time. There are no open sores, no preulcerative lesions, no rash or signs of infection present.  Vascular: Dorsalis Pedis artery and Posterior Tibial artery pedal pulses are 2/4 bilateral with immedate capillary fill time. Pedal hair growth present. No varicosities and no lower extremity edema present bilateral.   Neruologic: Grossly intact via light touch bilateral. Vibratory intact via tuning fork bilateral. Protective threshold with Semmes Wienstein monofilament intact to all pedal sites bilateral. Patellar and Achilles deep  tendon reflexes 2+ bilateral. No Babinski or clonus noted bilateral.   Musculoskeletal: No gross boney pedal deformities bilateral. No pain, crepitus, or limitation noted with foot and ankle range of motion bilateral. Muscular strength 5/5 in all groups tested bilateral.  Pain on palpation medial calcaneal tubercles bilateral.  Gait: Unassisted, Nonantalgic.    Radiographs:  Radiographs demonstrate soft tissue increase in density plantar fashion calcaneal insertion site of the bilateral heel.  No acute findings.  Assessment & Plan:   Assessment: Plantar fasciitis bilateral.  Plan: Discussed etiology pathology conservative versus surgical therapies.  Start her on a Medrol Dosepak to follow be followed by meloxicam.  Offered her injections which she declined today.  Placed her in bilateral plantar fascial braces and a night splint.  Discussed appropriate shoe gear stretching exercise ice therapy sugar modification.     Tavares Levinson T. [place] HaverstrawHyatt, [place] DakotaDPM

## 2017-09-29 ENCOUNTER — Ambulatory Visit (INDEPENDENT_AMBULATORY_CARE_PROVIDER_SITE_OTHER): Payer: BLUE CROSS/BLUE SHIELD | Admitting: Orthotics

## 2017-09-29 DIAGNOSIS — M722 Plantar fascial fibromatosis: Secondary | ICD-10-CM

## 2017-09-30 NOTE — Progress Notes (Signed)
Patient came into today for casting bilateral f/o to address plantar fasciitis.  Patient reports history of foot pain involving plantar aponeurosis.  Goal is to provide longitudinal arch support and correct any RF instability due to heel eversion/inversion.  Ultimate goal is to relieve tension at pf insertion calcaneal tuberosity.  Plan on semi-rigid device addressing heel stability and relieving PF tension.      EVER

## 2017-10-02 ENCOUNTER — Other Ambulatory Visit: Payer: Self-pay

## 2017-10-02 ENCOUNTER — Emergency Department
Admission: EM | Admit: 2017-10-02 | Discharge: 2017-10-02 | Disposition: A | Discharge status: Home / Self Care | Payer: BLUE CROSS/BLUE SHIELD | Attending: Emergency Medicine | Admitting: Emergency Medicine

## 2017-10-02 ENCOUNTER — Encounter: Payer: Self-pay | Admitting: Emergency Medicine

## 2017-10-02 DIAGNOSIS — M79674 Pain in right toe(s): Secondary | ICD-10-CM | POA: Diagnosis present

## 2017-10-02 DIAGNOSIS — L600 Ingrowing nail: Secondary | ICD-10-CM | POA: Diagnosis not present

## 2017-10-02 MED ORDER — SULFAMETHOXAZOLE-TRIMETHOPRIM 800-160 MG PO TABS: 1.0000 | ORAL_TABLET | Freq: Two times a day (BID) | ORAL | 0 refills | Status: DC

## 2017-10-02 MED ORDER — NAPROXEN 500 MG PO TABS: 500.0000 mg | ORAL_TABLET | Freq: Two times a day (BID) | ORAL | Status: DC

## 2017-10-02 NOTE — ED Provider Notes (Signed)
[facility] Emergency Department Provider Note   ____________________________________________   First MD Initiated Contact with Patient 10/02/17 [phone]     (approximate)  I have reviewed the triage vital signs and the nursing notes.   HISTORY  Chief Complaint Toe Pain    HPI Jenna Marks is a 22 y.o. female patient complain of increasing right toe pain that started a couple weeks ago.  Patient state nailbed is slightly swollen and red.  Patient denies drainage from site.  Patient rates pain as a 5/10.  Patient described the pain is "sore".  No palliative measures for complaint.  Patient is 3 months postpartum and is not breast-feeding. Past Medical History:  Diagnosis Date  . Medical history non-contributory     Patient Active Problem List   Diagnosis Date Noted  . Postpartum care following vaginal delivery 05/30/2017  . Back pain 01/22/2017    History reviewed. No pertinent surgical history.  Prior to Admission medications   Medication Sig Start Date End Date Taking? Authorizing Provider  medroxyPROGESTERone (DEPO-PROVERA) 150 MG/ML injection Inject 1 mL (150 mg total) into the muscle every 3 (three) months. 06/01/17   Oswaldo Conroy, CNM  meloxicam (MOBIC) 15 MG tablet Take 1 tablet (15 mg total) by mouth daily. 09/20/17   Hyatt, Max T, DPM  methylPREDNISolone (MEDROL DOSEPAK) 4 MG TBPK tablet 6 day dose pack - take as directed 09/20/17   Hyatt, Max T, DPM  naproxen (NAPROSYN) 500 MG tablet Take 1 tablet (500 mg total) by mouth 2 (two) times daily with a meal. 10/02/17   Joni Reining, PA-C  sulfamethoxazole-trimethoprim (BACTRIM DS,SEPTRA DS) 800-160 MG tablet Take 1 tablet by mouth 2 (two) times daily. 10/02/17   Joni Reining, PA-C    Allergies Patient has no known allergies.  Family History  Problem Relation Age of Onset  . Cancer Neg Hx   . Diabetes Neg Hx   . Heart failure Neg Hx   . Stroke Neg Hx   . Thyroid disease Neg Hx      Social History Social History   Tobacco Use  . Smoking status: Never Smoker  . Smokeless tobacco: Never Used  Substance Use Topics  . Alcohol use: No  . Drug use: No    Review of Systems Constitutional: No fever/chills Eyes: No visual changes. ENT: No sore throat. Cardiovascular: Denies chest pain. Respiratory: Denies shortness of breath. Gastrointestinal: No abdominal pain.  No nausea, no vomiting.  No diarrhea.  No constipation. Genitourinary: Negative for dysuria. Musculoskeletal: Negative for back pain. Skin: Negative for rash.  Redness and swelling right lateral nail of the great toe. Neurological: Negative for headaches, focal weakness or numbness.   ____________________________________________   PHYSICAL EXAM:  VITAL SIGNS: ED Triage Vitals  Enc Vitals Group     BP      Pulse      Resp      Temp      Temp src      SpO2      Weight      Height      Head Circumference      Peak Flow      Pain Score      Pain Loc      Pain Edu?      Excl. in GC?    Constitutional: Alert and oriented. Well appearing and in no acute distress. Cardiovascular: Normal rate, regular rhythm. Grossly normal heart sounds.  Good peripheral circulation. Respiratory: Normal respiratory  effort.  No retractions. Lungs CTAB. Neurologic:  Normal speech and language. No gross focal neurologic deficits are appreciated. No gait instability. Skin:  Skin is warm, dry and intact. No rash noted.  Edema and erythema right great toe lateral nail bed. Psychiatric: Mood and affect are normal. Speech and behavior are normal.  ____________________________________________   LABS (all labs ordered are listed, but only abnormal results are displayed)  Labs Reviewed - No data to display ____________________________________________  EKG   ____________________________________________  RADIOLOGY  ED MD interpretation:    Official radiology report(s): No results  found.  ____________________________________________   PROCEDURES  Procedure(s) performed: None  Procedures  Critical Care performed: No  ____________________________________________   INITIAL IMPRESSION / ASSESSMENT AND PLAN / ED COURSE  As part of my medical decision making, I reviewed the following data within the electronic MEDICAL RECORD NUMBER    Ingrown toenail with infection in the right great toe.  Patient given discharge care instruction.  Patient advised take medication as directed.  Patient advised follow-up PCP as needed.      ____________________________________________   FINAL CLINICAL IMPRESSION(S) / ED DIAGNOSES  Final diagnoses:  Ingrown toenail of right foot with infection     ED Discharge Orders        Ordered    sulfamethoxazole-trimethoprim (BACTRIM DS,SEPTRA DS) 800-160 MG tablet  2 times daily     10/02/17 0855    naproxen (NAPROSYN) 500 MG tablet  2 times daily with meals     10/02/17 0855       Note:  This document was prepared using Dragon voice recognition software and may include unintentional dictation errors.    Joni ReiningSmith, Laporsha Grealish K, PA-C 10/02/17 0900    Sharyn CreamerQuale, Mark, MD 10/02/17 1515

## 2017-10-02 NOTE — Discharge Instructions (Addendum)
Advised to soak foot in Epson salt solution for approximately 10 minutes/day for 2 to 3 days.  Follow discharge care instructions and take antibiotics as directed.

## 2017-10-02 NOTE — ED Triage Notes (Signed)
Pt c/o of R/toe pain.Pt states pain started " couple of weeks ago but the pain is getting worse". Denies any fever. VSS. NAD noted.

## 2017-10-11 ENCOUNTER — Ambulatory Visit
Admission: EM | Admit: 2017-10-11 | Discharge: 2017-10-11 | Disposition: A | Discharge status: Home / Self Care | Payer: BLUE CROSS/BLUE SHIELD | Attending: Family Medicine | Admitting: Family Medicine

## 2017-10-11 ENCOUNTER — Other Ambulatory Visit: Payer: Self-pay

## 2017-10-11 DIAGNOSIS — L600 Ingrowing nail: Secondary | ICD-10-CM | POA: Diagnosis not present

## 2017-10-11 MED ORDER — MUPIROCIN 2 % EX OINT: 1.0000 "application " | TOPICAL_OINTMENT | Freq: Two times a day (BID) | CUTANEOUS | 0 refills | Status: AC

## 2017-10-11 NOTE — ED Provider Notes (Signed)
[facility]    CSN: [id number] Arrival date & time: 10/11/17  1509  History   Chief Complaint Chief Complaint  Patient presents with  . Nail Problem   HPI  22 year old female presents with the above complaint.  Patient reports ongoing issues with ingrown toenails.  Has been going on for the past month.  She states that she was recently seen in the ER and was placed on Bactrim.  She reports no improvement.  She reports redness and drainage as well as swelling around the nailbeds, particularly the right great toe.  Left great toe is also affected.  Additionally, she has some redness around the nailbed of the second toes bilaterally.  She endorses compliance with the Bactrim that she was given.  No fevers or chills.  She has not seen a podiatrist.  No other associated symptoms.  No other complaints.  Past Medical History:  Diagnosis Date  . Medical history non-contributory     Patient Active Problem List   Diagnosis Date Noted  . Postpartum care following vaginal delivery 05/30/2017  . Back pain 01/22/2017    History reviewed. No pertinent surgical history.  OB History    Gravida  1   Para  1   Term  1   Preterm      AB      Living  1     SAB      TAB      Ectopic      Multiple  0   Live Births  1            Home Medications    Prior to Admission medications   Medication Sig Start Date End Date Taking? Authorizing Provider  naproxen (NAPROSYN) 500 MG tablet Take 1 tablet (500 mg total) by mouth 2 (two) times daily with a meal. 10/02/17  Yes Joni ReiningSmith, Ronald K, PA-C  sulfamethoxazole-trimethoprim (BACTRIM DS,SEPTRA DS) 800-160 MG tablet Take 1 tablet by mouth 2 (two) times daily. 10/02/17  Yes Joni ReiningSmith, Ronald K, PA-C  mupirocin ointment (BACTROBAN) 2 % Apply 1 application topically 2 (two) times daily for 7 days. 10/11/17 10/18/17  Tommie Samsook, Karington Zarazua G, DO    Family History Family History  Problem Relation Age of Onset  . Cancer Neg Hx   . Diabetes Neg  Hx   . Heart failure Neg Hx   . Stroke Neg Hx   . Thyroid disease Neg Hx     Social History Social History   Tobacco Use  . Smoking status: Never Smoker  . Smokeless tobacco: Never Used  Substance Use Topics  . Alcohol use: No  . Drug use: No     Allergies   Patient has no known allergies.   Review of Systems Review of Systems  Constitutional: Negative.   Skin:       Ingrown toenails, drainage, pain.   Physical Exam Triage Vital Signs ED Triage Vitals  Enc Vitals Group     BP 10/11/17 1520 97/85     Pulse Rate 10/11/17 1520 90     Resp 10/11/17 1520 18     Temp 10/11/17 1520 98.6 F (37 C)     Temp Source 10/11/17 1520 Oral     SpO2 10/11/17 1520 100 %     Weight 10/11/17 1521 160 lb (72.6 kg)     Height 10/11/17 1521 5\' 4"  (1.626 m)     Head Circumference --      Peak Flow --  Pain Score 10/11/17 1521 8     Pain Loc --      Pain Edu? --      Excl. in GC? --    Updated Vital Signs BP 97/85 (BP Location: Left Arm)   Pulse 90   Temp 98.6 F (37 C) (Oral)   Resp 18   Ht 5\' 4"  (1.626 m)   Wt 72.6 kg   LMP 10/10/2017   SpO2 100%   Breastfeeding? No   BMI 27.46 kg/m   Visual Acuity Right Eye Distance:   Left Eye Distance:   Bilateral Distance:    Right Eye Near:   Left Eye Near:    Bilateral Near:     Physical Exam  Constitutional: She is oriented to person, place, and time. She appears well-developed. No distress.  HENT:  Head: Normocephalic and atraumatic.  Pulmonary/Chest: Effort normal. No respiratory distress.  Neurological: She is alert and oriented to person, place, and time.  Skin:  Ingrown toenails noted on both the right and left great toes.  Right greater than left.  Patient also has formation around the nailbed of both the second toes.  Psychiatric: She has a normal mood and affect. Her behavior is normal.  Nursing note and vitals reviewed.  UC Treatments / Results  Labs (all labs ordered are listed, but only abnormal  results are displayed) Labs Reviewed - No data to display  EKG None  Radiology No results found.  Procedures Procedures (including critical care time)  Medications Ordered in UC Medications - No data to display  Initial Impression / Assessment and Plan / UC Course  I have reviewed the triage vital signs and the nursing notes.  Pertinent labs & imaging results that were available during my care of the patient were reviewed by me and considered in my medical decision making (see chart for details).    22 year old female presents with ingrown toenails.  Finish Bactrim.  Placing on Bactroban ointment.  Warm soaks.  Advised to see podiatry.  Final Clinical Impressions(s) / UC Diagnoses   Final diagnoses:  Ingrown toenail     Discharge Instructions     Warm soaks.  Topical antibiotic.  Call podiatry.  Take care  Dr. Adriana Simasook    ED Prescriptions    Medication Sig Dispense Auth. Provider   mupirocin ointment (BACTROBAN) 2 % Apply 1 application topically 2 (two) times daily for 7 days. 22 g Tommie Samsook, Yurem Viner G, DO     Controlled Substance Prescriptions [place] Controlled Substance Registry consulted? Not Applicable   Tommie SamsCook, Jacqueline Delapena G, DO 10/11/17 1548

## 2017-10-11 NOTE — ED Triage Notes (Signed)
Patient complains of ingrown toenail on both big toes and redness around multiple toes. Patient states that she is currently taking Bactrim that she started 9 days but doesn't feel like her toes are getting any better.

## 2017-10-11 NOTE — Discharge Instructions (Signed)
Warm soaks.  Topical antibiotic.  Call podiatry.  Take care  Dr. Adriana Simasook

## 2017-10-20 ENCOUNTER — Encounter: Payer: Self-pay | Admitting: Podiatry

## 2017-10-20 ENCOUNTER — Ambulatory Visit: Payer: Medicaid Other | Admitting: Orthotics

## 2017-10-20 ENCOUNTER — Ambulatory Visit (INDEPENDENT_AMBULATORY_CARE_PROVIDER_SITE_OTHER): Payer: Medicaid Other | Admitting: Podiatry

## 2017-10-20 DIAGNOSIS — M722 Plantar fascial fibromatosis: Secondary | ICD-10-CM

## 2017-10-20 NOTE — Progress Notes (Signed)
She presents today for follow-up of her plantar fasciitis.  She is here to pick up her orthotics states that she is continues to take her meloxicam on a regular basis but seems to help.  Otherwise her heels have not been doing real good for her at all.  Objective: Vital signs are stable she is alert and oriented x3.  Pulses are palpable.  Still has pain on palpation medial calcaneal tubercles bilateral left greater than right.  Assessment: Plantar fasciitis bilateral.  Plan: She is here today to pick up her orthotics they were dispensed for her today I also offered her injections which she declined.  I encouraged her to continue meloxicam.

## 2017-10-20 NOTE — Progress Notes (Signed)
Patient came in today to pick up custom made foot orthotics.  The goals were accomplished and the patient reported no dissatisfaction with said orthotics.  Patient was advised of breakin period and how to report any issues. 

## 2018-03-02 NOTE — L&D Delivery Note (Signed)
Obstetrical Delivery Note   Date of Delivery:   03/02/2019 Primary OB:   [facility] Gestational Age/EDD: [redacted]w[redacted]d (Dated by LMP) Antepartum complications: gestational hypertension  Delivered By:   Dalia Heading, CNM  Delivery Type:   spontaneous vaginal delivery  Procedure Details:   CTSP for non reassuring fetal heart tracing. Cervix C/C/+1 to +2. Mother pushed to deliver a viable female infant in OA over intact perineum. Nuchal cord x 3 reduced on perineum. Baby dried, placed on mother's abdomen and given tactile stimulation. After delayed cord clamping, the cord was cut by the father of the baby. Baby was taken to the warmer briefly for further evaluation, then brought back to mother to hold. Spontaneous delivery of intact placenta and 3 vessel cord followed by atony of lower uterine segment. The atony responded to IV Pitocin, evacuation of clots from the lower uterine segment, Cytotec 800 mcg PR and fundal massage. EBL 400 ml. Small periurethral laceration not needing repair. No vaginal or cervical lacerations.  Anesthesia:    epidural Intrapartum complications: Non-reassuring Fetal Status and uterine atony and gestational hypertension. GBS:    negative Laceration:    none Episiotomy:    none Placenta:    Via active 3rd stage. To pathology: no Estimated Blood Loss:  400 ml Baby:    Liveborn female, Apgars 7/9,  weight 9#1 oz(4120g)    Dalia Heading, CNM

## 2018-07-05 ENCOUNTER — Encounter: Payer: Self-pay | Admitting: Advanced Practice Midwife

## 2018-07-05 ENCOUNTER — Other Ambulatory Visit: Payer: Self-pay

## 2018-07-05 ENCOUNTER — Ambulatory Visit (INDEPENDENT_AMBULATORY_CARE_PROVIDER_SITE_OTHER): Payer: Medicaid Other | Admitting: Advanced Practice Midwife

## 2018-07-05 VITALS — Wt 159.0 lb

## 2018-07-05 DIAGNOSIS — Z3481 Encounter for supervision of other normal pregnancy, first trimester: Secondary | ICD-10-CM

## 2018-07-05 DIAGNOSIS — Z3480 Encounter for supervision of other normal pregnancy, unspecified trimester: Secondary | ICD-10-CM

## 2018-07-05 DIAGNOSIS — Z3A01 Less than 8 weeks gestation of pregnancy: Secondary | ICD-10-CM | POA: Diagnosis not present

## 2018-07-05 NOTE — Progress Notes (Signed)
07/05/2018   Virtual Visit via Telephone Note  I connected with Jenna Marks on 07/05/18 at  9:20 AM EDT by telephone and verified that I am speaking with the correct person using two identifiers.   I discussed the limitations, risks, security and privacy concerns of performing an evaluation and management service by telephone and the availability of in person appointments. I also discussed with the patient that there may be a patient responsible charge related to this service. The patient expressed understanding and agreed to proceed.  The patient was located at [facility] I spoke with the patient from my  Office phone The names of people involved in this encounter were: the patient Jenna Marks- her 21 year old was also with her, and myself Tresea Mall, CNM  Chief Complaint: Missed period  Transfer of Care Patient: no  History of Present Illness: [name] is a 23 y.o. G2P1001 [redacted]w[redacted]d based on Patient's last menstrual period was 05/24/2018. with an Estimated Date of Delivery: 02/28/19, with the above CC.   Her periods were: regular periods every 30 days She was using no method of birth control when she conceived.  She has Positive signs or symptoms of pregnancy, of increased frequency of urination and breast tenderness She has Negative signs or symptoms of miscarriage or preterm labor She was not taking different medications around the time she conceived/early pregnancy. Since her LMP, she has not used alcohol Since her LMP, she has not used tobacco products Since her LMP, she has not used illegal drugs.   She claims she has gained   a few pounds since the start of her pregnancy.  Current or past history of domestic violence. no  Infection History:  1. Since her LMP, she has not had a viral illness.  2. She admits to close contact with children on a regular basis   3. She denies a history of chicken pox illness 4. Patient or partner has history of genital herpes  no  5. History of STI (GC, CT, HPV, syphilis, HIV)  no  6.  She does not live with someone with TB or TB exposed. 7. History of recent travel :  no 8. She identifies Negative Zika risk factors for her and her partner 23. There are cats in the home in the home  yes If yes Indoor. She understands that while pregnant she should not change cat litter.   Genetic Screening Questions: (Includes patient, baby's father, or anyone in either family)   1. Patient's age >/= 49 [facility]  no 2. Thalassemia ([place], [place], Mediterranean, or Asian background): MCV<80  no 3. Neural tube defect (meningomyelocele, spina bifida, anencephaly)  no 4. Congenital heart defect  no  5. Down syndrome  no 6. Tay-Sachs (Jewish, [place]  no 7. Canavan's Disease  no 8. Sickle cell disease or trait (African)  no  9. Hemophilia or other blood disorders  no  10. Muscular dystrophy  no  11. Cystic fibrosis  no  12. Huntington's Chorea  no  13. Mental retardation/autism  no 14. Other inherited genetic or chromosomal disorder  no 15. Maternal metabolic disorder (DM, PKU, etc)  no 16. Patient or FOB with a child with a birth defect not listed above no  16a. Patient or FOB with a birth defect themselves no 17. Recurrent pregnancy loss, or stillbirth  no  18. Any medications since LMP other than prenatal vitamins (include vitamins, supplements, OTC meds, drugs, alcohol)  no 19. Any other genetic/environmental exposure to  discuss  no  Review of Systems  Constitutional: Positive for malaise/fatigue.  HENT: Negative.   Eyes: Negative.   Respiratory: Negative.   Cardiovascular: Negative.   Gastrointestinal: Negative.   Genitourinary: Positive for frequency.  Musculoskeletal: Negative.   Skin: Negative.   Neurological: Negative.   Endo/Heme/Allergies: Negative.   Psychiatric/Behavioral: Negative.   Breast: Tenderness   OBGYN History: As per HPI. OB History  Gravida Para Term Preterm AB Living  2 1 1     1    SAB TAB Ectopic Multiple Live Births        0 1    # Outcome Date GA Lbr Len/2nd Weight Sex Delivery Anes PTL Lv  2 Current           1 Term 05/30/17 [id number] 12:48 / 00:37 7 lb 4 oz (3.289 kg) M Vag-Spont None  LIV    Any issues with any prior pregnancies: no Any prior children are healthy, doing well, without any problems or issues: yes History of pap smears: <21 History of STIs: No   Past Medical History: Past Medical History:  Diagnosis Date  . Medical history non-contributory     Past Surgical History: History reviewed. No pertinent surgical history.  Family History:  Family History  Problem Relation Age of Onset  . Cancer Neg Hx   . Diabetes Neg Hx   . Heart failure Neg Hx   . Stroke Neg Hx   . Thyroid disease Neg Hx    She denies any female cancers, bleeding or blood clotting disorders.  She denies any history of mental retardation, birth defects or genetic disorders in her or the FOB's history  Social History:  Social History   Socioeconomic History  . Marital status: Single    Spouse name: Not on file  . Number of children: 1  . Years of education: Not on file  . Highest education level: Not on file  Occupational History  . Not on file  Social Needs  . Financial resource strain: Not on file  . Food insecurity:    Worry: Not on file    Inability: Not on file  . Transportation needs:    Medical: Not on file    Non-medical: Not on file  Tobacco Use  . Smoking status: Never Smoker  . Smokeless tobacco: Never Used  Substance and Sexual Activity  . Alcohol use: No  . Drug use: No  . Sexual activity: Yes  Lifestyle  . Physical activity:    Days per week: Not on file    Minutes per session: Not on file  . Stress: Not on file  Relationships  . Social connections:    Talks on phone: Not on file    Gets together: Not on file    Attends religious service: Not on file    Active member of club or organization: Not on file    Attends meetings of clubs or  organizations: Not on file    Relationship status: Not on file  . Intimate partner violence:    Fear of current or ex partner: Not on file    Emotionally abused: Not on file    Physically abused: Not on file    Forced sexual activity: Not on file  Other Topics Concern  . Not on file  Social History Narrative  . Not on file    Allergy: No Known Allergies  Current Outpatient Medications: No current outpatient medications on file.   Physical Exam: Physical Exam could not be  performed. Because of the COVID-19 outbreak this visit was performed over the phone and not in person.    Assessment: Ms. Mcclave is a 23 y.o. G2P1001 [redacted]w[redacted]d based on Patient's last menstrual period was 05/24/2018. with an Estimated Date of Delivery: 02/28/19,  for prenatal care.  Plan:  1) Avoid alcoholic beverages. 2) Patient encouraged not to smoke.  3) Discontinue the use of all non-medicinal drugs and chemicals.  4) Take prenatal vitamins daily.  5) Seatbelt use advised 6) Nutrition, food safety (fish, cheese advisories, and high nitrite foods) and exercise discussed. 7) Hospital and practice style delivering at [facility] discussed  8) Patient is asked about travel to areas at risk for the Zika virus, and counseled to avoid travel and exposure to mosquitoes or sexual partners who may have themselves been exposed to the virus. Testing is discussed, and will be ordered as appropriate.  9) Childbirth classes at [facility] advised 10) Genetic Screening, such as with 1st Trimester Screening, cell free fetal DNA, AFP testing, and Ultrasound, as well as with amniocentesis and CVS as appropriate, is discussed with patient. She plans to have genetic testing this pregnancy. 11) Return to clinic in 4 weeks for dating scan and all NOB labs   Problem list reviewed and updated.  I discussed the assessment and treatment plan with the patient. The patient was provided an opportunity to ask questions and all were answered. The  patient agreed with the plan and demonstrated an understanding of the instructions.   The patient was advised to call back or seek an in-person evaluation if the symptoms worsen or if the condition fails to improve as anticipated.  I provided 20 minutes of non-face-to-face time during this encounter.  Parke Poisson, CNM [facility] 07/05/2018, 9:50 AM

## 2018-07-15 ENCOUNTER — Telehealth: Payer: Self-pay

## 2018-07-15 NOTE — Telephone Encounter (Signed)
Pt calling for note to be out or work d/t Covid19 to ensure she and baby are healthy.  She has had to go back to work.  Employer needs letter for pt to be out.  [phone]

## 2018-07-18 NOTE — Telephone Encounter (Signed)
Letter written and left up front for her. Pt aware via vm. I can fax also, if needed

## 2018-08-10 ENCOUNTER — Ambulatory Visit (INDEPENDENT_AMBULATORY_CARE_PROVIDER_SITE_OTHER): Payer: Medicaid Other

## 2018-08-10 ENCOUNTER — Ambulatory Visit (INDEPENDENT_AMBULATORY_CARE_PROVIDER_SITE_OTHER): Payer: Medicaid Other | Admitting: Maternal Newborn

## 2018-08-10 ENCOUNTER — Encounter: Payer: Self-pay | Admitting: Maternal Newborn

## 2018-08-10 ENCOUNTER — Other Ambulatory Visit: Payer: Self-pay

## 2018-08-10 ENCOUNTER — Other Ambulatory Visit (HOSPITAL_COMMUNITY)
Admission: RE | Admit: 2018-08-10 | Discharge: 2018-08-10 | Disposition: A | Discharge status: Home / Self Care | Payer: Medicaid Other | Source: Ambulatory Visit | Attending: Maternal Newborn | Admitting: Maternal Newborn

## 2018-08-10 VITALS — BP 122/74 | Wt 161.0 lb

## 2018-08-10 DIAGNOSIS — M25552 Pain in left hip: Secondary | ICD-10-CM | POA: Insufficient documentation

## 2018-08-10 DIAGNOSIS — Z3A10 10 weeks gestation of pregnancy: Secondary | ICD-10-CM

## 2018-08-10 DIAGNOSIS — Z3480 Encounter for supervision of other normal pregnancy, unspecified trimester: Secondary | ICD-10-CM | POA: Insufficient documentation

## 2018-08-10 DIAGNOSIS — N8312 Corpus luteum cyst of left ovary: Secondary | ICD-10-CM

## 2018-08-10 DIAGNOSIS — O3481 Maternal care for other abnormalities of pelvic organs, first trimester: Secondary | ICD-10-CM

## 2018-08-10 DIAGNOSIS — Z3A11 11 weeks gestation of pregnancy: Secondary | ICD-10-CM

## 2018-08-10 DIAGNOSIS — O9989 Other specified diseases and conditions complicating pregnancy, childbirth and the puerperium: Secondary | ICD-10-CM

## 2018-08-10 DIAGNOSIS — Z1379 Encounter for other screening for genetic and chromosomal anomalies: Secondary | ICD-10-CM

## 2018-08-10 LAB — OB RESULTS CONSOLE VARICELLA ZOSTER ANTIBODY, IGG: Varicella: NON-IMMUNE/NOT IMMUNE

## 2018-08-10 NOTE — Progress Notes (Signed)
No vb. No lof. Dating scan today 

## 2018-08-10 NOTE — Progress Notes (Signed)
    Routine Prenatal Care Visit  Subjective  Jenna Marks is a 23 y.o. G2P1001 at [redacted]w[redacted]d being seen today for ongoing prenatal care.  She is currently monitored for the following issues for this low-risk pregnancy and has Supervision of other normal pregnancy, antepartum on their problem list.  ----------------------------------------------------------------------------------- Patient reports left hip pain daily with a sensation that her hip joint is misaligned. It gets better when she can stretch out her leg and feels a "pop" at the joint. It is worse when she is on her feet a lot. The pain is intermittent and sometimes severe, occasionally feels that she will lose her balance when it occurs. Vag. Bleeding: None.    No leaking of fluid.  ----------------------------------------------------------------------------------- The following portions of the patient's history were reviewed and updated as appropriate: allergies, current medications, past family history, past medical history, past social history, past surgical history and problem list. Problem list updated.   Objective  Blood pressure 122/74, weight 161 lb (73 kg), last menstrual period 05/24/2018, not currently breastfeeding. Pregravid weight 156 lb (70.8 kg) Total Weight Gain 5 lb (2.268 kg)  Fetal Status: Fetal Heart Rate (bpm): 166         General:  Alert, oriented and cooperative. Patient is in no acute distress.  Skin: Skin is warm and dry. No rash noted.   Cardiovascular: Normal heart rate noted  Respiratory: Normal respiratory effort, no problems with respiration noted  Abdomen: Soft, gravid, appropriate for gestational age. Pain/Pressure: Absent     Pelvic:  Cervical exam deferred        Extremities: Normal range of motion.     Mental Status: Normal mood and affect. Normal behavior. Normal judgment and thought content.     Assessment   23 y.o. G2P1001 at [redacted]w[redacted]d, EDD 02/28/2019 by Last Menstrual Period presenting  for a routine prenatal visit.  Plan   pregnancy2 Problems (from 05/24/18 to present)    No problems associated with this episode.     Dating ultrasound today shows a singleton IUP at [redacted]w[redacted]d, size consistent with LMP dating. Results discussed with patient.  NOB labs and [id number] today.  Referral to Physical Therapy for persistent left hip pain and joint problem.  Please refer to After Visit Summary for other counseling recommendations.   Return in about 4 weeks (around 09/07/2018) for ROB.  Avel Sensor, CNM 08/10/2018  9:49 AM

## 2018-08-10 NOTE — Patient Instructions (Signed)
First Trimester of Pregnancy  The first trimester of pregnancy is from week 1 until the end of week 13 (months 1 through 3). A week after a sperm fertilizes an egg, the egg will implant on the wall of the uterus. This embryo will begin to develop into a baby. Genes from you and your partner will form the baby. The female genes will determine whether the baby will be a boy or a girl. At 6-8 weeks, the eyes and face will be formed, and the heartbeat can be seen on ultrasound. At the end of 12 weeks, all the baby's organs will be formed.  Now that you are pregnant, you will want to do everything you can to have a healthy baby. Two of the most important things are to get good prenatal care and to follow your health care provider's instructions. Prenatal care is all the medical care you receive before the baby's birth. This care will help prevent, find, and treat any problems during the pregnancy and childbirth.  Body changes during your first trimester  Your body goes through many changes during pregnancy. The changes vary from woman to woman.   You may gain or lose a couple of pounds at first.   You may feel sick to your stomach (nauseous) and you may throw up (vomit). If the vomiting is uncontrollable, call your health care provider.   You may tire easily.   You may develop headaches that can be relieved by medicines. All medicines should be approved by your health care provider.   You may urinate more often. Painful urination may mean you have a bladder infection.   You may develop heartburn as a result of your pregnancy.   You may develop constipation because certain hormones are causing the muscles that push stool through your intestines to slow down.   You may develop hemorrhoids or swollen veins (varicose veins).   Your breasts may begin to grow larger and become tender. Your nipples may stick out more, and the tissue that surrounds them (areola) may become darker.   Your gums may bleed and may be  sensitive to brushing and flossing.   Dark spots or blotches (chloasma, mask of pregnancy) may develop on your face. This will likely fade after the baby is born.   Your menstrual periods will stop.   You may have a loss of appetite.   You may develop cravings for certain kinds of food.   You may have changes in your emotions from day to day, such as being excited to be pregnant or being concerned that something may go wrong with the pregnancy and baby.   You may have more vivid and strange dreams.   You may have changes in your hair. These can include thickening of your hair, rapid growth, and changes in texture. Some women also have hair loss during or after pregnancy, or hair that feels dry or thin. Your hair will most likely return to normal after your baby is born.  What to expect at prenatal visits  During a routine prenatal visit:   You will be weighed to make sure you and the baby are growing normally.   Your blood pressure will be taken.   Your abdomen will be measured to track your baby's growth.   The fetal heartbeat will be listened to between weeks 10 and 14 of your pregnancy.   Test results from any previous visits will be discussed.  Your health care provider may ask you:     How you are feeling.   If you are feeling the baby move.   If you have had any abnormal symptoms, such as leaking fluid, bleeding, severe headaches, or abdominal cramping.   If you are using any tobacco products, including cigarettes, chewing tobacco, and electronic cigarettes.   If you have any questions.  Other tests that may be performed during your first trimester include:   Blood tests to find your blood type and to check for the presence of any previous infections. The tests will also be used to check for low iron levels (anemia) and protein on red blood cells (Rh antibodies). Depending on your risk factors, or if you previously had diabetes during pregnancy, you may have tests to check for high blood sugar  that affects pregnant women (gestational diabetes).   Urine tests to check for infections, diabetes, or protein in the urine.   An ultrasound to confirm the proper growth and development of the baby.   Fetal screens for spinal cord problems (spina bifida) and Down syndrome.   HIV (human immunodeficiency virus) testing. Routine prenatal testing includes screening for HIV, unless you choose not to have this test.   You may need other tests to make sure you and the baby are doing well.  Follow these instructions at home:  Medicines   Follow your health care provider's instructions regarding medicine use. Specific medicines may be either safe or unsafe to take during pregnancy.   Take a prenatal vitamin that contains at least 600 micrograms (mcg) of folic acid.   If you develop constipation, try taking a stool softener if your health care provider approves.  Eating and drinking     Eat a balanced diet that includes fresh fruits and vegetables, whole grains, good sources of protein such as meat, eggs, or tofu, and low-fat dairy. Your health care provider will help you determine the amount of weight gain that is right for you.   Avoid raw meat and uncooked cheese. These carry germs that can cause birth defects in the baby.   Eating four or five small meals rather than three large meals a day may help relieve nausea and vomiting. If you start to feel nauseous, eating a few soda crackers can be helpful. Drinking liquids between meals, instead of during meals, also seems to help ease nausea and vomiting.   Limit foods that are high in fat and processed sugars, such as fried and sweet foods.   To prevent constipation:  ? Eat foods that are high in fiber, such as fresh fruits and vegetables, whole grains, and beans.  ? Drink enough fluid to keep your urine clear or pale yellow.  Activity   Exercise only as directed by your health care provider. Most women can continue their usual exercise routine during  pregnancy. Try to exercise for 30 minutes at least 5 days a week. Exercising will help you:  ? Control your weight.  ? Stay in shape.  ? Be prepared for labor and delivery.   Experiencing pain or cramping in the lower abdomen or lower back is a good sign that you should stop exercising. Check with your health care provider before continuing with normal exercises.   Try to avoid standing for long periods of time. Move your legs often if you must stand in one place for a long time.   Avoid heavy lifting.   Wear low-heeled shoes and practice good posture.   You may continue to have sex unless your health care   provider tells you not to.  Relieving pain and discomfort   Wear a good support bra to relieve breast tenderness.   Take warm sitz baths to soothe any pain or discomfort caused by hemorrhoids. Use hemorrhoid cream if your health care provider approves.   Rest with your legs elevated if you have leg cramps or low back pain.   If you develop varicose veins in your legs, wear support hose. Elevate your feet for 15 minutes, 3-4 times a day. Limit salt in your diet.  Prenatal care   Schedule your prenatal visits by the twelfth week of pregnancy. They are usually scheduled monthly at first, then more often in the last 2 months before delivery.   Write down your questions. Take them to your prenatal visits.   Keep all your prenatal visits as told by your health care provider. This is important.  Safety   Wear your seat belt at all times when driving.   Make a list of emergency phone numbers, including numbers for family, friends, the hospital, and police and fire departments.  General instructions   Ask your health care provider for a referral to a local prenatal education class. Begin classes no later than the beginning of month 6 of your pregnancy.   Ask for help if you have counseling or nutritional needs during pregnancy. Your health care provider can offer advice or refer you to specialists for help  with various needs.   Do not use hot tubs, steam rooms, or saunas.   Do not douche or use tampons or scented sanitary pads.   Do not cross your legs for long periods of time.   Avoid cat litter boxes and soil used by cats. These carry germs that can cause birth defects in the baby and possibly loss of the fetus by miscarriage or stillbirth.   Avoid all smoking, herbs, alcohol, and medicines not prescribed by your health care provider. Chemicals in these products affect the formation and growth of the baby.   Do not use any products that contain nicotine or tobacco, such as cigarettes and e-cigarettes. If you need help quitting, ask your health care provider. You may receive counseling support and other resources to help you quit.   Schedule a dentist appointment. At home, brush your teeth with a soft toothbrush and be gentle when you floss.  Contact a health care provider if:   You have dizziness.   You have mild pelvic cramps, pelvic pressure, or nagging pain in the abdominal area.   You have persistent nausea, vomiting, or diarrhea.   You have a bad smelling vaginal discharge.   You have pain when you urinate.   You notice increased swelling in your face, hands, legs, or ankles.   You are exposed to fifth disease or chickenpox.   You are exposed to German measles (rubella) and have never had it.  Get help right away if:   You have a fever.   You are leaking fluid from your vagina.   You have spotting or bleeding from your vagina.   You have severe abdominal cramping or pain.   You have rapid weight gain or loss.   You vomit blood or material that looks like coffee grounds.   You develop a severe headache.   You have shortness of breath.   You have any kind of trauma, such as from a fall or a car accident.  Summary   The first trimester of pregnancy is from week 1 until   the end of week 13 (months 1 through 3).   Your body goes through many changes during pregnancy. The changes vary from  woman to woman.   You will have routine prenatal visits. During those visits, your health care provider will examine you, discuss any test results you may have, and talk with you about how you are feeling.  This information is not intended to replace advice given to you by your health care provider. Make sure you discuss any questions you have with your health care provider.  Document Released: 02/10/2001 Document Revised: 01/29/2016 Document Reviewed: 01/29/2016  Elsevier Interactive Patient Education  2019 [employer]

## 2018-08-11 LAB — URINE CYTOLOGY ANCILLARY ONLY
Chlamydia: NEGATIVE
Neisseria Gonorrhea: NEGATIVE

## 2018-08-12 LAB — RPR+RH+ABO+RUB AB+AB SCR+CB...
Antibody Screen: NEGATIVE
HIV Screen 4th Generation wRfx: NONREACTIVE
Hematocrit: 36.8 % (ref 34.0–46.6)
Hemoglobin: 11.9 g/dL (ref 11.1–15.9)
Hepatitis B Surface Ag: NEGATIVE
MCH: 27.4 pg (ref 26.6–33.0)
MCHC: 32.3 g/dL (ref 31.5–35.7)
MCV: 85 fL (ref 79–97)
Platelets: 305 10*3/uL (ref 150–450)
RBC: 4.34 x10E6/uL (ref 3.77–5.28)
RDW: 15.8 % — ABNORMAL HIGH (ref 11.7–15.4)
RPR Ser Ql: NONREACTIVE
Rh Factor: POSITIVE
Rubella Antibodies, IGG: 1.73 index (ref >0.99)
Varicella zoster IgG: <135 index — ABNORMAL LOW (ref >165)
WBC: 7.9 10*3/uL (ref 3.4–10.8)

## 2018-08-12 LAB — URINE CULTURE

## 2018-08-12 LAB — URINE DRUG PANEL 7
Amphetamines, Urine: NEGATIVE ng/mL
Barbiturate Quant, Ur: NEGATIVE ng/mL
Benzodiazepine Quant, Ur: NEGATIVE ng/mL
Cannabinoid Quant, Ur: NEGATIVE ng/mL
Cocaine (Metab.): NEGATIVE ng/mL
Opiate Quant, Ur: NEGATIVE ng/mL
PCP Quant, Ur: NEGATIVE ng/mL

## 2018-08-15 LAB — MATERNIT 21 PLUS CORE, BLOOD
Fetal Fraction: 11
Result (T21): NEGATIVE
Trisomy 13 (Patau syndrome): NEGATIVE
Trisomy 18 (Edwards syndrome): NEGATIVE
Trisomy 21 (Down syndrome): NEGATIVE

## 2018-08-17 ENCOUNTER — Ambulatory Visit: Payer: Medicaid Other | Attending: Maternal Newborn

## 2018-08-17 ENCOUNTER — Other Ambulatory Visit: Payer: Self-pay

## 2018-08-17 DIAGNOSIS — R293 Abnormal posture: Secondary | ICD-10-CM

## 2018-08-17 DIAGNOSIS — M62838 Other muscle spasm: Secondary | ICD-10-CM | POA: Diagnosis not present

## 2018-08-17 NOTE — Patient Instructions (Signed)
Stabilization: Diaphragmatic Breathing    Lie with knees bent, feet flat. Place one hand on stomach, other on chest. Breathe deeply through nose, lifting belly hand without any motion of hand on chest. Do this for 5 min. Per night and as much as you can throughout the day in sitting, standing, etc.   Flexors, Lunge  Hip Flexor Stretch: Proposal Pose    Maintain pelvic tuck under, lift pubic bone toward navel. Engage posterior hip muscles (firm glute muscles of leg in back position) and shift forward until you feel stretch on front of leg that is down. To increase stretch, maintain balance and ease hips forward. You may use one hand on a chair for balance if needed. Hold for __5__ breaths. Repeat __2-3__ times each leg.  Do _1-2__ times per day.

## 2018-08-17 NOTE — Therapy (Signed)
[facility] [address] [place], [place], [id number] Phone: [phone]   Fax:  [phone]  Physical Therapy Evaluation  The patient has been informed of current processes in place at Outpatient Rehab to protect patients from Covid-19 exposure including social distancing, schedule modifications, and new cleaning procedures. After discussing their particular risk with a therapist based on the patient's personal risk factors, the patient has decided to proceed with in-person therapy.   Patient Details  Name: Jenna Marks MRN: [id number] Date of Birth: [birth year] Referring Provider (PT): Marcelyn BruinsSchmid, Jacelyn   Encounter Date: 08/17/2018  PT End of Session - 08/18/18 1158    Visit Number  1    Number of Visits  4    Date for PT Re-Evaluation  09/07/18    Authorization Type  [employer]    Authorization Time Period  through 09/07/2018 (check auth)    Authorization - Visit Number  1    Authorization - Number of Visits  4    PT Start Time  1150    PT Stop Time  1243    PT Time Calculation (min)  53 min    Activity Tolerance  Patient tolerated treatment well;No increased pain    Behavior During Therapy  WFL for tasks assessed/performed       Past Medical History:  Diagnosis Date  . Medical history non-contributory     History reviewed. No pertinent surgical history.  There were no vitals filed for this visit.  Pelvic Floor Physical Therapy Evaluation and Assessment  SCREENING  Falls in last 6 mo: no   Red Flags:  Have you had any night sweats? no Unexplained weight loss? no Saddle anesthesia? no Unexplained changes in bowel or bladder habits? no  SUBJECTIVE  Patient reports: Starting having hip pain following the delivery of her fist child. L>R for pain. Pain can make her feel off balance/like she could fall. Has B plantar fasciitis which is worse on the L.  Precautions:  [redacted] weeks  pregnant  Social/Family/Vocational History:   Not working  Recent Procedures/Tests/Findings:  none  Obstetrical History: [redacted] weeks pregnant, prior vaginal delivery.  Gynecological History: none  Urinary History: none  Gastrointestinal History: none  Sexual activity/pain: none  Location of pain: L>R hip Current pain:  5/10  Max pain:  10/10 Least pain:  5/10 Nature of pain: deep ache  Patient Goals: Have her pain be gone.   OBJECTIVE  Posture/Observations:  Sitting: slouched, forward head Standing: flat-back, forward head Supine: L ASIS low Prone: L PSIS slightly high, L lumbar transverse process higher than R.  Palpation/Segmental Motion/Joint Play: TTP to L>R QL, Lumbar paraspinals. Decreased mobility and TTP through L>R sacral border. no TTP through Piriformis or OI, some at B pectineus.  Special tests:   Stork: negative Leg-length: R-84cm, L 83cm Supine-to-long-sit: LLE long in supine, even in sitting.  Range of Motion/Flexibilty:  Spine: WNL for motion but "catches" with rotation B.  Hips:   Strength/MMT: deferred to follow-up PRN LE MMT  LE MMT Left Right  Hip flex:  (L2) /5 /5  Hip ext: /5 /5  Hip abd: /5 /5  Hip add: /5 /5  Hip IR /5 /5  Hip ER /5 /5     Abdominal:  Palpation: TTP to B Iliacus and Psoas Diastasis: not visualized, not palpated  Pelvic Floor External Exam: Deferred until deemed neccessary due to covid-19 precautions  Introitus Appears:  Skin integrity:  Palpation: Cough: Prolapse visible?: Scar mobility:  Internal Vaginal Exam: Strength (PERF):  Symmetry: Palpation: Prolapse:   Internal Rectal Exam: Strength (PERF): Symmetry: Palpation: Prolapse:   Gait Analysis: Deferred to follow-up  Pelvic Floor Outcome Measures: Pt. Not given on initial visit on accident, will give on visit 2  INTERVENTIONS THIS SESSION: Therex: Given diaphragmatic breathing to improve efficiency of TP release at follow-up and  decrease tension in PFM and hip-flexor stretch to maintain and improve muscle length and allow for improved balance of musculature for long-term symptom relief. Self-care: Educated on the structure and function of the pelvic floor in relation to their symptoms as well as the POC, and initial HEP in order to set patient expectations and understanding from which we will build on in the future sessions.   Total time: 6653       [facility] PT Assessment - 08/18/18 0001      Assessment   Medical Diagnosis  Pain of Left hip joint    Referring Provider (PT)  Marcelyn BruinsSchmid, Jacelyn    Onset Date/Surgical Date  --   following first pregnancy   Prior Therapy  none      Precautions   Precautions  None      Restrictions   Weight Bearing Restrictions  No      Balance Screen   Has the patient fallen in the past 6 months  No      Home Environment   Living Environment  Private residence    Type of Home  House    Home Access  Level entry    Home Layout  One level    Home Equipment  None      Prior Function   Level of Independence  Independent    Vocation  Unemployed      Cognition   Overall Cognitive Status  Within Functional Limits for tasks assessed                Objective measurements completed on examination: See above findings.                PT Short Term Goals - 08/18/18 1214      PT SHORT TERM GOAL #1   Title  Patient will demonstrate improved pelvic alignment and balance of musculature surrounding the pelvis to facilitate decreased PFM spasms and decrease pelvic pain.    Baseline  L anterior rot. RLE apparent long. Spasms surrounding pelvis.    Time  3    Period  Weeks    Status  New    Target Date  09/07/18      PT SHORT TERM GOAL #2   Title  Patient will demonstrate HEP x1 in the clinic to demonstrate understanding and proper form to allow for further improvement.    Baseline  Pt. lacks knowledge of therapeutic exercises to decrease her pain.    Time  3     Period  Weeks    Status  New    Target Date  09/07/18      PT SHORT TERM GOAL #3   Title  Patient will report a reduction in pain to no greater than 5/10 over the prior week to demonstrate symptom improvement.    Baseline  Max of 10/10, average of 5/10 currently    Time  3    Period  Weeks    Status  New    Target Date  09/07/18        PT Long Term Goals - 08/18/18 1217  PT LONG TERM GOAL #1   Title  Patient will describe pain no greater than 2/10 during daily activities, bending, lifting, cleaning etc. to demonstrate improved functional ability.    Baseline  pain increases up to 10/10 and is 5/10 at lowest throughout activity and rest.    Time  8    Period  Weeks    Status  New    Target Date  10/12/18      PT LONG TERM GOAL #2   Title  Pt. will report ability to transfer, bend, lift and walk without feeling of instability to demonstrate improved pelvic alignment and balance of musculature and decreased risk of fall.    Baseline  Pt. feels unsteady and like she could fall with daily activities.    Time  8    Period  Weeks    Status  New    Target Date  10/12/18      PT LONG TERM GOAL #3   Title  Patient will demonstrate improved sitting and standing posture to demonstrate learning and decrease stress on the low back and SIJ/ Hip with functional activity.    Baseline  flat-back posture with L anterior rotation    Time  8    Period  Weeks    Status  New    Target Date  10/12/18             Plan - 08/18/18 1159    Clinical Impression Statement  Pt. is a 23 y/o female who is G2P1, [redacted] weeks pregnant and reports today with cheif c/o L>R hip pain and feeling of instability. Her relevant PMH includes 1 vaginal delivery and B plantar fasciitis. Her Clinical assessment revealed a L anterior rotation and apparent leg-length discrepancy of 1 cm (RLE long) TBD. flat-back and forward head posture, Spasms through B hip-flexors and L lumbar extensors (L lumbar transverse  processes prominent) as well as B pectineus with decreased mobility and pain with sacral mobs. She will benefit from skilled pelvic PT to address the noted defecits and to assess for and address other potential causes of pain.    Personal Factors and Comorbidities  Finances;Social Background;Age    Examination-Activity Limitations  Squat;Lift;Stairs;Stand;Locomotion Level;Bend;Caring for Others;Sit    Examination-Participation Restrictions  Shop;Laundry;Cleaning;Community Activity;Yard Work    Conservation officer, historic buildingstability/Clinical Decision Making  Evolving/Moderate complexity    Clinical Decision Making  Moderate    Rehab Potential  Good    PT Frequency  1x / week    PT Duration  8 weeks    PT Treatment/Interventions  ADLs/Self Care Home Management;Traction;Gait training;Therapeutic exercise;Therapeutic activities;Functional mobility training;Neuromuscular re-education;Patient/family education;Manual techniques;Dry needling;Taping;Spinal Manipulations;Joint Manipulations    PT Next Visit Plan  correct L anterior rotation/ TP release, educate on posture, re-measure leg-length for potential heel-lift.    PT Home Exercise Plan  hip-flexor stretch and diaphragmatic breathing.    Consulted and Agree with Plan of Care  Patient       Patient will benefit from skilled therapeutic intervention in order to improve the following deficits and impairments:  Difficulty walking, Increased muscle spasms, Improper body mechanics, Decreased activity tolerance, Postural dysfunction, Pain  Visit Diagnosis: 1. Other muscle spasm   2. Abnormal posture        Problem List Patient Active Problem List   Diagnosis Date Noted  . Pain of left hip joint 08/10/2018  . Supervision of other normal pregnancy, antepartum 07/05/2018   Jenna MoltKeeli T. Julius Marks DPT, ATC Jenna Marks 08/18/2018, 12:27 PM  [place]  [facility] MAIN [facility] SERVICES [address] [place], [place], [address] Phone: [phone]   Fax:   [phone]  Name: Jenna Marks MRN: [id number] Date of Birth: [birth year]

## 2018-08-24 ENCOUNTER — Ambulatory Visit: Payer: Medicaid Other

## 2018-08-31 ENCOUNTER — Other Ambulatory Visit: Payer: Self-pay

## 2018-08-31 ENCOUNTER — Ambulatory Visit: Payer: Medicaid Other | Attending: Maternal Newborn

## 2018-08-31 DIAGNOSIS — M62838 Other muscle spasm: Secondary | ICD-10-CM | POA: Insufficient documentation

## 2018-08-31 DIAGNOSIS — R293 Abnormal posture: Secondary | ICD-10-CM | POA: Diagnosis present

## 2018-08-31 NOTE — Therapy (Signed)
[place] MAIN [facility] SERVICES [address] [place], [place], [address] Phone: [phone]   Fax:  [phone]  Physical Therapy Treatment  The patient has been informed of current processes in place at Outpatient Rehab to protect patients from Covid-19 exposure including social distancing, schedule modifications, and new cleaning procedures. After discussing their particular risk with a therapist based on the patient's personal risk factors, the patient has decided to proceed with in-person therapy.   Patient Details  Name: Jenna Marks MRN: [id number] Date of Birth: 20-Feb-1996 Referring Provider (PT): Avel Sensor   Encounter Date: 08/31/2018  PT End of Session - 08/31/18 1401    Visit Number  2    Number of Visits  8    Date for PT Re-Evaluation  10/18/18    Authorization Type  MCA[employer]    Authorization Time Period  10/18/2018    Authorization - Visit Number  2    Authorization - Number of Visits  8    PT Start Time  3474    PT Stop Time  2595    PT Time Calculation (min)  60 min    Activity Tolerance  Patient tolerated treatment well;No increased pain    Behavior During Therapy  WFL for tasks assessed/performed       Past Medical History:  Diagnosis Date  . Medical history non-contributory     No past surgical history on file.  There were no vitals filed for this visit.    Pelvic Floor Physical Therapy Treatment Note  SCREENING  Changes in medications, allergies, or medical history?: none   SUBJECTIVE  Patient reports: Has been doing well with her exercises.  Precautions:  [redacted] weeks pregnant  Pain update:  Location of pain: L>R hip Current pain:  0/10  Max pain:  8/10 Least pain:  0/10 Nature of pain: deep ache  Patient Goals:  Have her pain be gone.   OBJECTIVE  Changes in: Posture/Observations:  L up-slip, hyperlordosis, B LE hyperextended.  Abdominal:   Requires maximal tactile, visual, and  verbal feedback to attain proper posture and coordination of muscles acting on pelvis.  Palpation: TTP to B Iliacus, Psoas, Pectineus, and L QL.   Gait Analysis: Sways side-to-side , narrow BOS, "falls" into her R>L hip, heavy foot-falls.  INTERVENTIONS THIS SESSION: Manual: Performed TP release to B Iliacus, Psoas, and Pectineus followed by L up-slip correction to decrease spasm and improve pelvic alignment to decrease  Therex: Educated on and practiced side-stretch and pelvic tilts in seated to improve balance of musculature surrounding the pelvis and decrease HF spasms through reciprocal inhibition. NM re-ed: Educated on and practiced sitting and standing posture with neutral pelvic tilt to decrease over-activation of hip-flexors and lumbar extensors creating a lower crossed syndrome. Educated on not holding her son on her L hip only due to over-activity of L QL likely leading to L up-slip.   Total time: 60 min.                           PT Short Term Goals - 08/18/18 1214      PT SHORT TERM GOAL #1   Title  Patient will demonstrate improved pelvic alignment and balance of musculature surrounding the pelvis to facilitate decreased PFM spasms and decrease pelvic pain.    Baseline  L anterior rot. RLE apparent long. Spasms surrounding pelvis.    Time  3    Period  Weeks  Status  New    Target Date  09/07/18      PT SHORT TERM GOAL #2   Title  Patient will demonstrate HEP x1 in the clinic to demonstrate understanding and proper form to allow for further improvement.    Baseline  Pt. lacks knowledge of therapeutic exercises to decrease her pain.    Time  3    Period  Weeks    Status  New    Target Date  09/07/18      PT SHORT TERM GOAL #3   Title  Patient will report a reduction in pain to no greater than 5/10 over the prior week to demonstrate symptom improvement.    Baseline  Max of 10/10, average of 5/10 currently    Time  3    Period  Weeks     Status  New    Target Date  09/07/18        PT Long Term Goals - 08/31/18 1251      PT LONG TERM GOAL #1   Title  Patient will describe pain no greater than 2/10 during daily activities, bending, lifting, cleaning etc. to demonstrate improved functional ability.    Baseline  pain increases up to 10/10 and is 5/10 at lowest throughout activity and rest.    Time  8    Period  Weeks    Status  New    Target Date  10/12/18      PT LONG TERM GOAL #2   Title  Pt. will report ability to transfer, bend, lift and walk without feeling of instability to demonstrate improved pelvic alignment and balance of musculature and decreased risk of fall.    Baseline  Pt. feels unsteady and like she could fall with daily activities.    Time  8    Period  Weeks    Status  New    Target Date  10/12/18      PT LONG TERM GOAL #3   Title  Patient will demonstrate improved sitting and standing posture to demonstrate learning and decrease stress on the low back and SIJ/ Hip with functional activity.    Baseline  flat-back posture with L anterior rotation    Time  8    Period  Weeks    Status  New    Target Date  10/12/18      PT LONG TERM GOAL #4   Title  Patient will score at or below 8/60 on the [facility] to demonstrate a clinically significant decrease in disability and improved functional ability.    Baseline  PDI: 17/60    Time  8    Period  Weeks    Status  New    Target Date  10/12/18            Plan - 08/31/18 1402    Clinical Impression Statement  Pt. responded well to all interventions today, demonstrating improved alignment and decreased pain and spasm surrounding the pelvis as well as understanding of all education and exercises given. Continue per POC.    Personal Factors and Comorbidities  Finances;Social Background;Age    Examination-Activity Limitations  Squat;Lift;Stairs;Stand;Locomotion Level;Bend;Caring for Others;Sit    Examination-Participation Restrictions   Shop;Laundry;Cleaning;Community Activity;Yard Work    Conservation officer, historic buildingstability/Clinical Decision Making  Evolving/Moderate complexity    Rehab Potential  Good    PT Frequency  1x / week    PT Duration  8 weeks    PT Treatment/Interventions  ADLs/Self Care Home Management;Traction;Gait training;Therapeutic exercise;Therapeutic  activities;Functional mobility training;Neuromuscular re-education;Patient/family education;Manual techniques;Dry needling;Taping;Spinal Manipulations;Joint Manipulations    PT Next Visit Plan  review coordination of tilts in seated, practice coordination in standing, use mirror/ball etc. educate on posture, re-measure leg-length for potential heel-lift.    PT Home Exercise Plan  hip-flexor stretch and diaphragmatic breathing, side-stretch and seated pelvic tilts.Marland Kitchen.    Consulted and Agree with Plan of Care  Patient       Patient will benefit from skilled therapeutic intervention in order to improve the following deficits and impairments:  Difficulty walking, Increased muscle spasms, Improper body mechanics, Decreased activity tolerance, Postural dysfunction, Pain  Visit Diagnosis: 1. Other muscle spasm   2. Abnormal posture        Problem List Patient Active Problem List   Diagnosis Date Noted  . Pain of left hip joint 08/10/2018  . Supervision of other normal pregnancy, antepartum 07/05/2018   Cleophus MoltKeeli T. Zack Crager DPT, ATC Cleophus MoltKeeli T Constanza Mincy 08/31/2018, 2:04 PM  [facility] [address] [place], [place], [address] Phone: [phone]   Fax:  [phone]  Name: Jenna Marks MRN: [id number] Date of Birth: 14-Feb-1996

## 2018-08-31 NOTE — Patient Instructions (Addendum)
 *  Try to not only hold your son on your Left ip, alternate or have him walk, put him in a carrier on your back  If you have to.   When seated, you want to maintain pelvic neutral with the shoulders gently down and back and ears in line with your shoulders. A lumbar roll such as the one below or a home-made towel-roll can be used for this purpose. Even Olympic athletes can only maintain proper seated posture for about 10 minutes without support!    Pictured: The Original [employer] Lumbar Roll     Hold for 30 seconds (5 deep breaths) and repeat 2-3 times on each side once a day     Do 2 sets of 15 tilts per day. Breathe in when you tilt forward (A) and out when you tuck under (B).

## 2018-09-07 ENCOUNTER — Encounter: Payer: Self-pay | Admitting: Obstetrics & Gynecology

## 2018-09-07 ENCOUNTER — Ambulatory Visit: Payer: Medicaid Other

## 2018-09-07 ENCOUNTER — Other Ambulatory Visit: Payer: Self-pay

## 2018-09-07 ENCOUNTER — Ambulatory Visit (INDEPENDENT_AMBULATORY_CARE_PROVIDER_SITE_OTHER): Payer: Medicaid Other | Admitting: Obstetrics & Gynecology

## 2018-09-07 VITALS — BP 120/80 | Wt 157.0 lb

## 2018-09-07 DIAGNOSIS — R293 Abnormal posture: Secondary | ICD-10-CM

## 2018-09-07 DIAGNOSIS — Z3689 Encounter for other specified antenatal screening: Secondary | ICD-10-CM

## 2018-09-07 DIAGNOSIS — Z3482 Encounter for supervision of other normal pregnancy, second trimester: Secondary | ICD-10-CM

## 2018-09-07 DIAGNOSIS — M62838 Other muscle spasm: Secondary | ICD-10-CM | POA: Diagnosis not present

## 2018-09-07 DIAGNOSIS — Z3A15 15 weeks gestation of pregnancy: Secondary | ICD-10-CM

## 2018-09-07 DIAGNOSIS — Z3480 Encounter for supervision of other normal pregnancy, unspecified trimester: Secondary | ICD-10-CM

## 2018-09-07 LAB — POCT URINALYSIS DIPSTICK OB
Glucose, UA: NEGATIVE
POC,PROTEIN,UA: NEGATIVE

## 2018-09-07 NOTE — Patient Instructions (Signed)
As you start wearing your heel-lift only wear it for an hour the first day and increase by an hour each day so you can allow for the body to adapt to the change easily without much pain. If your pain increases by more than 1-2 points, back off slightly or slow down how quickly you increase your wear time. Once you reach a full day of wear, use it as much as possible forever, even in house-shoes or flip-flops if necessary to keep yourself from reverting to bad pelvic and spinal alignment and having symptoms return.    Adjust-a-lift heel lift Can be found at [email or web address]    Sleep with a pillow between your knees/ankles to help decrease pain    *refer to pictures on phone for correct position. Unlock knees, slight tuck pelvis under, bring chin in.

## 2018-09-07 NOTE — Patient Instructions (Signed)

## 2018-09-07 NOTE — Therapy (Signed)
[facility] [address] [place], [place], [address] Phone: [phone]   Fax:  [phone]  Physical Therapy Treatment  The patient has been informed of current processes in place at Outpatient Rehab to protect patients from Covid-19 exposure including social distancing, schedule modifications, and new cleaning procedures. After discussing their particular risk with a therapist based on the patient's personal risk factors, the patient has decided to proceed with in-person therapy.   Patient Details  Name: Jenna Marks MRN: [id number] Date of Birth: 1995/11/09 Referring Provider (PT): Marcelyn BruinsSchmid, Jacelyn   Encounter Date: 09/07/2018  PT End of Session - 09/08/18 1713    Visit Number  3    Number of Visits  8    Date for PT Re-Evaluation  10/18/18    Authorization Type  MCAID    Authorization Time Period  10/18/2018    Authorization - Visit Number  3    Authorization - Number of Visits  8    PT Start Time  1134    PT Stop Time  1227    PT Time Calculation (min)  53 min    Activity Tolerance  Patient tolerated treatment well;No increased pain    Behavior During Therapy  WFL for tasks assessed/performed       Past Medical History:  Diagnosis Date  . Medical history non-contributory     No past surgical history on file.  There were no vitals filed for this visit.   Pelvic Floor Physical Therapy Treatment Note  SCREENING  Changes in medications, allergies, or medical history?: none    SUBJECTIVE  Patient reports: Has the most pain when she wakes up or was laying or sitting for a long time.  Precautions:  [redacted] weeks pregnant.  Pain update:  Location of pain: L anterior hip Current pain:  0/10  Max pain:  6/10 Least pain:  0/10 Nature of pain: deep ache  Patient Goals: Have her pain be gone.   OBJECTIVE  Changes in: Posture/Observations:  LASIS  Low in supine, L leg appears short  Range of  Motion/Flexibilty:  Decreased sacral mobility and pain with PA mobs on L.  Improved following treatment  Palpation: TTP to L QL   INTERVENTIONS THIS SESSION: NM re-ed: reviewed posture in standing with addition of heel-lift to improve body awareness and recruitment of TA and glutes as well as thoracic retractors for improved balance of musculature and decreased risk of spasm recurrence.  Manual: Performed PA mobs in side-lying along L sacral border and base to allow for to improve mobility of joint and surrounding connective tissue and decrease pressure on nerve roots for improved conductivity and function of down-stream tissues as well as improved ability to accept heel-lift without increased pain. Performed TP release to L QL to decrease pain in L hip and allow for balance of muscles acting on the LB and pelvis.    Total time: 60 min.                             PT Short Term Goals - 08/18/18 1214      PT SHORT TERM GOAL #1   Title  Patient will demonstrate improved pelvic alignment and balance of musculature surrounding the pelvis to facilitate decreased PFM spasms and decrease pelvic pain.    Baseline  L anterior rot. RLE apparent long. Spasms surrounding pelvis.    Time  3    Period  Weeks    Status  New    Target Date  09/07/18      PT SHORT TERM GOAL #2   Title  Patient will demonstrate HEP x1 in the clinic to demonstrate understanding and proper form to allow for further improvement.    Baseline  Pt. lacks knowledge of therapeutic exercises to decrease her pain.    Time  3    Period  Weeks    Status  New    Target Date  09/07/18      PT SHORT TERM GOAL #3   Title  Patient will report a reduction in pain to no greater than 5/10 over the prior week to demonstrate symptom improvement.    Baseline  Max of 10/10, average of 5/10 currently    Time  3    Period  Weeks    Status  New    Target Date  09/07/18        PT Long Term Goals - 08/31/18  1251      PT LONG TERM GOAL #1   Title  Patient will describe pain no greater than 2/10 during daily activities, bending, lifting, cleaning etc. to demonstrate improved functional ability.    Baseline  pain increases up to 10/10 and is 5/10 at lowest throughout activity and rest.    Time  8    Period  Weeks    Status  New    Target Date  10/12/18      PT LONG TERM GOAL #2   Title  Pt. will report ability to transfer, bend, lift and walk without feeling of instability to demonstrate improved pelvic alignment and balance of musculature and decreased risk of fall.    Baseline  Pt. feels unsteady and like she could fall with daily activities.    Time  8    Period  Weeks    Status  New    Target Date  10/12/18      PT LONG TERM GOAL #3   Title  Patient will demonstrate improved sitting and standing posture to demonstrate learning and decrease stress on the low back and SIJ/ Hip with functional activity.    Baseline  flat-back posture with L anterior rotation    Time  8    Period  Weeks    Status  New    Target Date  10/12/18      PT LONG TERM GOAL #4   Title  Patient will score at or below 8/60 on the [facility] to demonstrate a clinically significant decrease in disability and improved functional ability.    Baseline  PDI: 17/60    Time  8    Period  Weeks    Status  New    Target Date  10/12/18            Plan - 09/08/18 1714    Clinical Impression Statement  Pt. Pt. Responded well to all interventions today, demonstrating improved pelvic alignment with addition of heel-lift and decreased QL spasm as well as understanding and correct performance of all education and exercises provided today. They will continue to benefit from skilled physical therapy to work toward remaining goals and maximize function as well as decrease likelihood of symptom increase or recurrence.    Personal Factors and Comorbidities  Finances;Social Background;Age    Examination-Activity Limitations   Squat;Lift;Stairs;Stand;Locomotion Level;Bend;Caring for Others;Sit    Examination-Participation Restrictions  Shop;Laundry;Cleaning;Community Activity;Yard Work    Conservation officer, historic buildingstability/Clinical Decision Making  Evolving/Moderate complexity  Rehab Potential  Good    PT Frequency  1x / week    PT Duration  8 weeks    PT Treatment/Interventions  ADLs/Self Care Home Management;Traction;Gait training;Therapeutic exercise;Therapeutic activities;Functional mobility training;Neuromuscular re-education;Patient/family education;Manual techniques;Dry needling;Taping;Spinal Manipulations;Joint Manipulations    PT Next Visit Plan  review coordination of tilts in seated, practice coordination in standing, use mirror/ball etc. assess gait with heel-lift in. TA in quad, standing hip EXT and ABD.    PT Home Exercise Plan  hip-flexor stretch and diaphragmatic breathing, side-stretch and seated pelvic tilts.    Consulted and Agree with Plan of Care  Patient       Patient will benefit from skilled therapeutic intervention in order to improve the following deficits and impairments:  Difficulty walking, Increased muscle spasms, Improper body mechanics, Decreased activity tolerance, Postural dysfunction, Pain  Visit Diagnosis: 1. Other muscle spasm   2. Abnormal posture        Problem List Patient Active Problem List   Diagnosis Date Noted  . Pain of left hip joint 08/10/2018  . Supervision of other normal pregnancy, antepartum 07/05/2018   Willa Rough DPT, ATC Willa Rough 09/08/2018, 5:22 PM  [place] MAIN [facility] SERVICES [address] [place], [place], [address] Phone: [phone]   Fax:  [phone]  Name: Jenna Marks MRN: [id number] Date of Birth: 1995/11/19

## 2018-09-07 NOTE — Progress Notes (Signed)
  Subjective  No nausea or pain or bleeding No FB yet  Objective  BP 120/80   Wt 157 lb (71.2 kg)   LMP 05/24/2018   BMI 26.95 kg/m  General: NAD Pumonary: no increased work of breathing Abdomen: gravid, non-tender Extremities: no edema Psychiatric: mood appropriate, affect full  Assessment  23 y.o. G2P1001 at [redacted]w[redacted]d by  02/28/2019, by Last Menstrual Period presenting for routine prenatal visit  Plan   Problem List Items Addressed This Visit      Other   Supervision of other normal pregnancy, antepartum    Other Visit Diagnoses    [redacted] weeks gestation of pregnancy    -  Primary   Relevant Orders   POC Urinalysis Dipstick OB (Completed)   Screening, antenatal, for fetal anatomic survey       Relevant Orders   US OB Comp + 14 Wk      pregnancy2 Problems (from 05/24/18 to present)    Problem Noted Resolved   Supervision of other normal pregnancy, antepartum 07/05/2018 by Rod Can, CNM No   Overview Addendum 09/07/2018 10:02 AM by Gae Dry, MD    Clinic [facility] Prenatal Labs  Dating [place] Blood type: O/Positive/-- (06/10 0939)   Genetic Screen NIPS: neg XX Antibody:Negative (06/10 0939)  Anatomic [place]  Rubella: 1.73 (06/10 0939) Varicella: NI  GTT    Third trimester:  RPR: Non Reactive (06/10 0939)   Rhogam n/a HBsAg: Negative (06/10 0939)   TDaP vaccine  Flu Shot: HIV: Non Reactive (06/10 0939)   Baby Food                                GBS:   Contraception  Pap:  CBB     CS/VBAC NA   Support Person Kirk Ruths, MD, [facility], [facility] 09/07/2018  10:08 AM

## 2018-09-14 ENCOUNTER — Other Ambulatory Visit: Payer: Self-pay

## 2018-09-14 ENCOUNTER — Ambulatory Visit: Payer: Medicaid Other

## 2018-09-14 DIAGNOSIS — M62838 Other muscle spasm: Secondary | ICD-10-CM | POA: Diagnosis not present

## 2018-09-14 DIAGNOSIS — R293 Abnormal posture: Secondary | ICD-10-CM

## 2018-09-14 NOTE — Patient Instructions (Signed)
Cat / Cow Flow    Exhale, press spine toward ceiling like a Halloween cat, feeling the belly-button draw in toward your back bone. Keeping strength in arms and abdominals, Inhale to soften spine through neutral and into cow pose. Open chest and arch back. Initiate movement between cat and cow at tailbone, one vertebrae at a time. Repeat __30__ times.  Shoulder Retraction and Downward Rotation   Rotate the shoulder blades back and down as if you had to hold a pencil between them, holding for 1 full second each time. Repeat this _3x10_ times _1-3_ times per day.      Start with Shoulder Retraction and Downward Rotation pictures above, holding the position while pulling the chin straight back as if trying to make a "double chin".  Breathe in forward and breathe out as you pull back, repeating this 10x3_ times _1-3___ times per day.      Inhale as you lower down gradually releasing the glutes and the lower tummy muscles. As you exhale, gradually increase the tension in the glutes and lower tummy until you reach the top and give an "extra squeeze" at the top. Repeat 10x_3_, __1-2_ Times per day.

## 2018-09-14 NOTE — Therapy (Signed)
[place] MAIN [facility] SERVICES [address] [place], [place], [address] Phone: [phone]   Fax:  [phone]  Physical Therapy Treatment  The patient has been informed of current processes in place at Outpatient Rehab to protect patients from Covid-19 exposure including social distancing, schedule modifications, and new cleaning procedures. After discussing their particular risk with a therapist based on the patient's personal risk factors, the patient has decided to proceed with in-person therapy.   Patient Details  Name: Jenna Marks MRN: [id number] Date of Birth: [birth year] Referring Provider (PT): Avel Sensor   Encounter Date: 09/14/2018  PT End of Session - 09/15/18 2001    Visit Number  4    Number of Visits  8    Date for PT Re-Evaluation  10/18/18    Authorization Type  [employer]    Authorization Time Period  10/18/2018    Authorization - Visit Number  4    Authorization - Number of Visits  8    PT Start Time  9326    PT Stop Time  1229    PT Time Calculation (min)  50 min    Activity Tolerance  Patient tolerated treatment well;No increased pain    Behavior During Therapy  WFL for tasks assessed/performed       Past Medical History:  Diagnosis Date  . Medical history non-contributory     No past surgical history on file.  There were no vitals filed for this visit.    Pelvic Floor Physical Therapy Treatment Note  SCREENING  Changes in medications, allergies, or medical history?: none    SUBJECTIVE  Patient reports: Had increased pain the first day she wore the heel-lift due to over-doing it that was the worst pain, only a 3-4 at worst otherwise, mostly when when sitting or laying down for a longer time.  Precautions:  [redacted] weeks pregnant  Pain update:  Location of pain: LLB/ SIJ Current pain: 0/10  Max pain: 6/10 Least pain: 0/10 Nature of pain:deep ache  Patient Goals: Have her pain be  gone.   OBJECTIVE  Changes in: Posture/Observations:  Hyperkyphotic  Palpation: TTP to L Multifidus, QL, Paraspinals, OI  INTERVENTIONS THIS SESSION: Manual: Performed TP release to L Multifidus, QL, Paraspinals, and OI to decrease spasm and pain and allow for improved balance of musculature for improved function and decreased symptoms. Therex: Educated on kneeling hip-hinges, cat-cow for TA, scappular retractions, and chin-tucks to improve strength of muscles opposing tight musculature to allow reciprocal inhibition to improve balance of musculature surrounding the pelvis and improve overall posture for optimal musculature length-tension relationship and function.  Total time: 50                            PT Short Term Goals - 08/18/18 1214      PT SHORT TERM GOAL #1   Title  Patient will demonstrate improved pelvic alignment and balance of musculature surrounding the pelvis to facilitate decreased PFM spasms and decrease pelvic pain.    Baseline  L anterior rot. RLE apparent long. Spasms surrounding pelvis.    Time  3    Period  Weeks    Status  New    Target Date  09/07/18      PT SHORT TERM GOAL #2   Title  Patient will demonstrate HEP x1 in the clinic to demonstrate understanding and proper form to allow for further improvement.    Baseline  Pt. lacks knowledge of therapeutic exercises to decrease her pain.    Time  3    Period  Weeks    Status  New    Target Date  09/07/18      PT SHORT TERM GOAL #3   Title  Patient will report a reduction in pain to no greater than 5/10 over the prior week to demonstrate symptom improvement.    Baseline  Max of 10/10, average of 5/10 currently    Time  3    Period  Weeks    Status  New    Target Date  09/07/18        PT Long Term Goals - 08/31/18 1251      PT LONG TERM GOAL #1   Title  Patient will describe pain no greater than 2/10 during daily activities, bending, lifting, cleaning etc. to  demonstrate improved functional ability.    Baseline  pain increases up to 10/10 and is 5/10 at lowest throughout activity and rest.    Time  8    Period  Weeks    Status  New    Target Date  10/12/18      PT LONG TERM GOAL #2   Title  Pt. will report ability to transfer, bend, lift and walk without feeling of instability to demonstrate improved pelvic alignment and balance of musculature and decreased risk of fall.    Baseline  Pt. feels unsteady and like she could fall with daily activities.    Time  8    Period  Weeks    Status  New    Target Date  10/12/18      PT LONG TERM GOAL #3   Title  Patient will demonstrate improved sitting and standing posture to demonstrate learning and decrease stress on the low back and SIJ/ Hip with functional activity.    Baseline  flat-back posture with L anterior rotation    Time  8    Period  Weeks    Status  New    Target Date  10/12/18      PT LONG TERM GOAL #4   Title  Patient will score at or below 8/60 [facility] the [facility] to demonstrate a clinically significant decrease in disability and improved functional ability.    Baseline  PDI: 17/60    Time  8    Period  Weeks    Status  New    Target Date  10/12/18            Plan - 09/15/18 2002    Clinical Impression Statement  Pt. Responded well to all interventions today, demonstrating decreased spasms and pain as well as understanding and correct performance of all education and exercises provided today. They will continue to benefit from skilled physical therapy to work toward remaining goals and maximize function as well as decrease likelihood of symptom increase or recurrence.     PT Next Visit Plan  review coordination of tilts in seated, practice coordination in standing, use mirror/ball etc. assess gait with heel-lift in. TA in quad, standing hip EXT and ABD.    PT Home Exercise Plan  hip-flexor stretch and diaphragmatic breathing, side-stretch and seated pelvic tilts.       Patient  will benefit from skilled therapeutic intervention in order to improve the following deficits and impairments:     Visit Diagnosis: 1. Other muscle spasm   2. Abnormal posture        Problem List Patient  Active Problem List   Diagnosis Date Noted  . Pain of left hip joint 08/10/2018  . Supervision of other normal pregnancy, antepartum 07/05/2018   Cleophus MoltKeeli T.  DPT, ATC Cleophus MoltKeeli T  09/15/2018, 8:05 PM  [place] [facility] [address] [place], [place], [id number] Phone: [phone]   Fax:  [phone]  Name: Jenna Marks MRN: [id number] Date of Birth: Aug 15, 1995

## 2018-09-21 ENCOUNTER — Ambulatory Visit: Payer: Medicaid Other

## 2018-09-28 ENCOUNTER — Ambulatory Visit: Payer: Medicaid Other

## 2018-10-05 ENCOUNTER — Encounter: Payer: Self-pay | Admitting: Maternal Newborn

## 2018-10-05 ENCOUNTER — Ambulatory Visit (INDEPENDENT_AMBULATORY_CARE_PROVIDER_SITE_OTHER): Payer: Medicaid Other

## 2018-10-05 ENCOUNTER — Ambulatory Visit: Payer: Medicaid Other

## 2018-10-05 ENCOUNTER — Other Ambulatory Visit: Payer: Self-pay

## 2018-10-05 ENCOUNTER — Ambulatory Visit (INDEPENDENT_AMBULATORY_CARE_PROVIDER_SITE_OTHER): Payer: Medicaid Other | Admitting: Maternal Newborn

## 2018-10-05 VITALS — BP 100/60 | Wt 160.4 lb

## 2018-10-05 DIAGNOSIS — Z3689 Encounter for other specified antenatal screening: Secondary | ICD-10-CM

## 2018-10-05 DIAGNOSIS — Z363 Encounter for antenatal screening for malformations: Secondary | ICD-10-CM | POA: Diagnosis not present

## 2018-10-05 DIAGNOSIS — Z3480 Encounter for supervision of other normal pregnancy, unspecified trimester: Secondary | ICD-10-CM

## 2018-10-05 DIAGNOSIS — Z3482 Encounter for supervision of other normal pregnancy, second trimester: Secondary | ICD-10-CM

## 2018-10-05 DIAGNOSIS — Z3A19 19 weeks gestation of pregnancy: Secondary | ICD-10-CM

## 2018-10-05 LAB — POCT URINALYSIS DIPSTICK OB
Glucose, UA: NEGATIVE
POC,PROTEIN,UA: NEGATIVE

## 2018-10-05 NOTE — Patient Instructions (Signed)

## 2018-10-05 NOTE — Progress Notes (Signed)
ROB and Anatomy scan- no concerns 

## 2018-10-05 NOTE — Addendum Note (Signed)
Addended by: Drenda Freeze on: 10/05/2018 11:28 AM   Modules accepted: Orders

## 2018-10-05 NOTE — Progress Notes (Signed)
    Routine Prenatal Care Visit  Subjective  Jenna Marks is a 23 y.o. G2P1001 at [redacted]w[redacted]d being seen today for ongoing prenatal care.  She is currently monitored for the following issues for this low-risk pregnancy and has Supervision of other normal pregnancy, antepartum and Pain of left hip joint on their problem list.  ----------------------------------------------------------------------------------- Patient reports hip pain has improved; she has a shoe insert from physical therapy that is helping. Vag. Bleeding: None.  Movement: Absent. No leaking of fluid.  ----------------------------------------------------------------------------------- The following portions of the patient's history were reviewed and updated as appropriate: allergies, current medications, past family history, past medical history, past social history, past surgical history and problem list. Problem list updated.   Objective  Blood pressure 100/60, weight 160 lb 6.4 oz (72.8 kg), last menstrual period 05/24/2018, not currently breastfeeding. Pregravid weight 156 lb (70.8 kg) Total Weight Gain 4 lb 6.4 oz (1.996 kg) Urinalysis: Urine dipstick shows negative for glucose, protein.  Fetal Status: Fetal Heart Rate (bpm): 144 ([place])   Movement: Absent     General:  Alert, oriented and cooperative. Patient is in no acute distress.  Skin: Skin is warm and Marks. No rash noted.   Cardiovascular: Normal heart rate noted  Respiratory: Normal respiratory effort, no problems with respiration noted  Abdomen: Soft, gravid, appropriate for gestational age. Pain/Pressure: Absent     Pelvic:  Cervical exam deferred        Extremities: Normal range of motion.     Mental Status: Normal mood and affect. Normal behavior. Normal judgment and thought content.     Assessment   23 y.o. G2P1001 at [redacted]w[redacted]d, EDD 02/28/2019 by Last Menstrual Period presenting for a routine prenatal visit.  Plan   pregnancy2 Problems (from 05/24/18 to  present)    Problem Noted Resolved   Supervision of other normal pregnancy, antepartum 07/05/2018 by Jenna Marks, CNM No   Overview Addendum 09/07/2018 10:02 AM by Jenna Dry, MD    Clinic [facility] Prenatal Labs  Dating [place] Blood type: O/Positive/-- (06/10 0939)   Genetic Screen NIPS: neg XX Antibody:Negative (06/10 0939)  Anatomic [place]  Rubella: 1.73 (06/10 0939) Varicella: NI  GTT    Third trimester:  RPR: Non Reactive (06/10 0939)   Rhogam n/a HBsAg: Negative (06/10 0939)   TDaP vaccine  Flu Shot: HIV: Non Reactive (06/10 0939)   Baby Food                                GBS:   Contraception  Pap:  CBB     CS/VBAC NA   Support Person Jenna Marks           Anatomy scan is complete and normal today. Breech presentation, FHR 144. Results reviewed with patient.   Please refer to After Visit Summary for other counseling recommendations.   Return in about 4 weeks (around 11/02/2018) for [place] telephone.  Jenna Marks, CNM 10/05/2018  11:20 AM

## 2018-11-02 ENCOUNTER — Encounter: Payer: Self-pay | Admitting: Advanced Practice Midwife

## 2018-11-02 ENCOUNTER — Ambulatory Visit (INDEPENDENT_AMBULATORY_CARE_PROVIDER_SITE_OTHER): Payer: Medicaid Other | Admitting: Advanced Practice Midwife

## 2018-11-02 DIAGNOSIS — Z3480 Encounter for supervision of other normal pregnancy, unspecified trimester: Secondary | ICD-10-CM

## 2018-11-02 DIAGNOSIS — Z113 Encounter for screening for infections with a predominantly sexual mode of transmission: Secondary | ICD-10-CM

## 2018-11-02 DIAGNOSIS — Z131 Encounter for screening for diabetes mellitus: Secondary | ICD-10-CM

## 2018-11-02 DIAGNOSIS — Z3A23 23 weeks gestation of pregnancy: Secondary | ICD-10-CM | POA: Diagnosis not present

## 2018-11-02 DIAGNOSIS — Z3482 Encounter for supervision of other normal pregnancy, second trimester: Secondary | ICD-10-CM | POA: Diagnosis not present

## 2018-11-02 DIAGNOSIS — Z130 Encounter for screening for diseases of the blood and blood-forming organs and certain disorders involving the immune mechanism: Secondary | ICD-10-CM

## 2018-11-02 NOTE — Progress Notes (Signed)
ROB telephone  No concerns Denies lof, no vb and Good FM

## 2018-11-02 NOTE — Progress Notes (Signed)
Routine Prenatal Care Visit- Virtual Visit  Subjective   Virtual Visit via Telephone Note  I connected with Jenna Marks on 11/02/18 at 10:10 AM EDT by telephone and verified that I am speaking with the correct person using two identifiers.   I discussed the limitations, risks, security and privacy concerns of performing an evaluation and management service by telephone and the availability of in person appointments. I also discussed with the patient that there may be a patient responsible charge related to this service. The patient expressed understanding and agreed to proceed.  The patient was at home I spoke with the patient from my  Office phone The names of people involved in this encounter were: the patient Jenna Marks and myself Jenna Marks CNM.   Jenna Marks is a 23 y.o. G2P1001 at [redacted]w[redacted]d being seen today for ongoing prenatal care.  She is currently monitored for the following issues for this low-risk pregnancy and has Supervision of other normal pregnancy, antepartum and Pain of left hip joint on their problem list.  ----------------------------------------------------------------------------------- Patient reports still having hip and pelvic pain. She reports good fetal movement.   Contractions: Not present. Vag. Bleeding: None.  Movement: Present. Denies leaking of fluid.  ----------------------------------------------------------------------------------- The following portions of the patient's history were reviewed and updated as appropriate: allergies, current medications, past family history, past medical history, past social history, past surgical history and problem list. Problem list updated.   Objective  Last menstrual period 05/24/2018, not currently breastfeeding. Pregravid weight 156 lb (70.8 kg) Total Weight Gain 4 lb 6.4 oz (1.996 kg) Urinalysis:      Fetal Status:     Movement: Present     Physical Exam could not be performed. Because of  the COVID-19 outbreak this visit was performed over the phone and not in person.   Assessment   23 y.o. G2P1001 at [redacted]w[redacted]d by  02/28/2019, by Last Menstrual Period presenting for routine prenatal visit  Plan   pregnancy2 Problems (from 05/24/18 to present)    Problem Noted Resolved   Supervision of other normal pregnancy, antepartum 07/05/2018 by Jenna Marks, CNM No   Overview Addendum 09/07/2018 10:02 AM by Gae Dry, MD    Clinic [facility] Prenatal Labs  Dating [place] Blood type: O/Positive/-- (06/10 0939)   Genetic Screen NIPS: neg XX Antibody:Negative (06/10 0939)  Anatomic [place]  Rubella: 1.73 (06/10 0939) Varicella: NI  GTT    Third trimester:  RPR: Non Reactive (06/10 0939)   Rhogam n/a HBsAg: Negative (06/10 0939)   TDaP vaccine  Flu Shot: HIV: Non Reactive (06/10 0939)   Baby Food                                GBS:   Contraception  Pap:  CBB     CS/VBAC NA   Support Person [place]              Gestational age appropriate obstetric precautions including but not limited to vaginal bleeding, contractions, leaking of fluid and fetal movement were reviewed in detail with the patient.     Follow Up Instructions: Hip/pelvic pain/pressure: soak in tub with epsom salt, hip support band   I discussed the assessment and treatment plan with the patient. The patient was provided an opportunity to ask questions and all were answered. The patient agreed with the plan and demonstrated an understanding of the instructions.   The patient was advised  to call back or seek an in-person evaluation if the symptoms worsen or if the condition fails to improve as anticipated.  I provided 10 minutes of non-face-to-face time during this encounter.  Return in about 4 weeks (around 11/30/2018) for 28 wk labs and rob.   Jenna MallJane Latausha Marks, CNM [facility] 11/02/2018, 10:28 AM

## 2018-11-30 ENCOUNTER — Ambulatory Visit (INDEPENDENT_AMBULATORY_CARE_PROVIDER_SITE_OTHER): Payer: Medicaid Other | Admitting: Advanced Practice Midwife

## 2018-11-30 ENCOUNTER — Encounter: Payer: Self-pay | Admitting: Advanced Practice Midwife

## 2018-11-30 ENCOUNTER — Other Ambulatory Visit: Payer: Self-pay

## 2018-11-30 ENCOUNTER — Other Ambulatory Visit: Payer: Medicaid Other

## 2018-11-30 VITALS — BP 118/74 | Wt 171.0 lb

## 2018-11-30 DIAGNOSIS — Z113 Encounter for screening for infections with a predominantly sexual mode of transmission: Secondary | ICD-10-CM

## 2018-11-30 DIAGNOSIS — Z131 Encounter for screening for diabetes mellitus: Secondary | ICD-10-CM

## 2018-11-30 DIAGNOSIS — Z3A27 27 weeks gestation of pregnancy: Secondary | ICD-10-CM

## 2018-11-30 DIAGNOSIS — Z3480 Encounter for supervision of other normal pregnancy, unspecified trimester: Secondary | ICD-10-CM

## 2018-11-30 DIAGNOSIS — Z130 Encounter for screening for diseases of the blood and blood-forming organs and certain disorders involving the immune mechanism: Secondary | ICD-10-CM

## 2018-11-30 DIAGNOSIS — Z3482 Encounter for supervision of other normal pregnancy, second trimester: Secondary | ICD-10-CM

## 2018-11-30 NOTE — Patient Instructions (Signed)
Third Trimester of Pregnancy The third trimester is from week 28 through week 40 (months 7 through 9). The third trimester is a time when the unborn baby (fetus) is growing rapidly. At the end of the ninth month, the fetus is about 20 inches in length and weighs 6-10 pounds. Body changes during your third trimester Your body will continue to go through many changes during pregnancy. The changes vary from woman to woman. During the third trimester:  Your weight will continue to increase. You can expect to gain 25-35 pounds (11-16 kg) by the end of the pregnancy.  You may begin to get stretch marks on your hips, abdomen, and breasts.  You may urinate more often because the fetus is moving lower into your pelvis and pressing on your bladder.  You may develop or continue to have heartburn. This is caused by increased hormones that slow down muscles in the digestive tract.  You may develop or continue to have constipation because increased hormones slow digestion and cause the muscles that push waste through your intestines to relax.  You may develop hemorrhoids. These are swollen veins (varicose veins) in the rectum that can itch or be painful.  You may develop swollen, bulging veins (varicose veins) in your legs.  You may have increased body aches in the pelvis, back, or thighs. This is due to weight gain and increased hormones that are relaxing your joints.  You may have changes in your hair. These can include thickening of your hair, rapid growth, and changes in texture. Some women also have hair loss during or after pregnancy, or hair that feels dry or thin. Your hair will most likely return to normal after your baby is born.  Your breasts will continue to grow and they will continue to become tender. A yellow fluid (colostrum) may leak from your breasts. This is the first milk you are producing for your baby.  Your belly button may stick out.  You may notice more swelling in your hands,  face, or ankles.  You may have increased tingling or numbness in your hands, arms, and legs. The skin on your belly may also feel numb.  You may feel short of breath because of your expanding uterus.  You may have more problems sleeping. This can be caused by the size of your belly, increased need to urinate, and an increase in your body's metabolism.  You may notice the fetus "dropping," or moving lower in your abdomen (lightening).  You may have increased vaginal discharge.  You may notice your joints feel loose and you may have pain around your pelvic bone. What to expect at prenatal visits You will have prenatal exams every 2 weeks until week 36. Then you will have weekly prenatal exams. During a routine prenatal visit:  You will be weighed to make sure you and the baby are growing normally.  Your blood pressure will be taken.  Your abdomen will be measured to track your baby's growth.  The fetal heartbeat will be listened to.  Any test results from the previous visit will be discussed.  You may have a cervical check near your due date to see if your cervix has softened or thinned (effaced).  You will be tested for Group B streptococcus. This happens between 35 and 37 weeks. Your health care provider may ask you:  What your birth plan is.  How you are feeling.  If you are feeling the baby move.  If you have had any abnormal   symptoms, such as leaking fluid, bleeding, severe headaches, or abdominal cramping.  If you are using any tobacco products, including cigarettes, chewing tobacco, and electronic cigarettes.  If you have any questions. Other tests or screenings that may be performed during your third trimester include:  Blood tests that check for low iron levels (anemia).  Fetal testing to check the health, activity level, and growth of the fetus. Testing is done if you have certain medical conditions or if there are problems during the pregnancy.  Nonstress test  (NST). This test checks the health of your baby to make sure there are no signs of problems, such as the baby not getting enough oxygen. During this test, a belt is placed around your belly. The baby is made to move, and its heart rate is monitored during movement. What is false labor? False labor is a condition in which you feel small, irregular tightenings of the muscles in the womb (contractions) that usually go away with rest, changing position, or drinking water. These are called Braxton Hicks contractions. Contractions may last for hours, days, or even weeks before true labor sets in. If contractions come at regular intervals, become more frequent, increase in intensity, or become painful, you should see your health care provider. What are the signs of labor?  Abdominal cramps.  Regular contractions that start at 10 minutes apart and become stronger and more frequent with time.  Contractions that start on the top of the uterus and spread down to the lower abdomen and back.  Increased pelvic pressure and dull back pain.  A watery or bloody mucus discharge that comes from the vagina.  Leaking of amniotic fluid. This is also known as your "water breaking." It could be a slow trickle or a gush. Let your health care provider know if it has a color or strange odor. If you have any of these signs, call your health care provider right away, even if it is before your due date. Follow these instructions at home: Medicines  Follow your health care provider's instructions regarding medicine use. Specific medicines may be either safe or unsafe to take during pregnancy.  Take a prenatal vitamin that contains at least 600 micrograms (mcg) of folic acid.  If you develop constipation, try taking a stool softener if your health care provider approves. Eating and drinking   Eat a balanced diet that includes fresh fruits and vegetables, whole grains, good sources of protein such as meat, eggs, or tofu,  and low-fat dairy. Your health care provider will help you determine the amount of weight gain that is right for you.  Avoid raw meat and uncooked cheese. These carry germs that can cause birth defects in the baby.  If you have low calcium intake from food, talk to your health care provider about whether you should take a daily calcium supplement.  Eat four or five small meals rather than three large meals a day.  Limit foods that are high in fat and processed sugars, such as fried and sweet foods.  To prevent constipation: ? Drink enough fluid to keep your urine clear or pale yellow. ? Eat foods that are high in fiber, such as fresh fruits and vegetables, whole grains, and beans. Activity  Exercise only as directed by your health care provider. Most women can continue their usual exercise routine during pregnancy. Try to exercise for 30 minutes at least 5 days a week. Stop exercising if you experience uterine contractions.  Avoid heavy lifting.  Do   not exercise in extreme heat or humidity, or at high altitudes.  Wear low-heel, comfortable shoes.  Practice good posture.  You may continue to have sex unless your health care provider tells you otherwise. Relieving pain and discomfort  Take frequent breaks and rest with your legs elevated if you have leg cramps or low back pain.  Take warm sitz baths to soothe any pain or discomfort caused by hemorrhoids. Use hemorrhoid cream if your health care provider approves.  Wear a good support bra to prevent discomfort from breast tenderness.  If you develop varicose veins: ? Wear support pantyhose or compression stockings as told by your healthcare provider. ? Elevate your feet for 15 minutes, 3-4 times a day. Prenatal care  Write down your questions. Take them to your prenatal visits.  Keep all your prenatal visits as told by your health care provider. This is important. Safety  Wear your seat belt at all times when driving.  Make  a list of emergency phone numbers, including numbers for family, friends, the hospital, and police and fire departments. General instructions  Avoid cat litter boxes and soil used by cats. These carry germs that can cause birth defects in the baby. If you have a cat, ask someone to clean the litter box for you.  Do not travel far distances unless it is absolutely necessary and only with the approval of your health care provider.  Do not use hot tubs, steam rooms, or saunas.  Do not drink alcohol.  Do not use any products that contain nicotine or tobacco, such as cigarettes and e-cigarettes. If you need help quitting, ask your health care provider.  Do not use any medicinal herbs or unprescribed drugs. These chemicals affect the formation and growth of the baby.  Do not douche or use tampons or scented sanitary pads.  Do not cross your legs for long periods of time.  To prepare for the arrival of your baby: ? Take prenatal classes to understand, practice, and ask questions about labor and delivery. ? Make a trial run to the hospital. ? Visit the hospital and tour the maternity area. ? Arrange for maternity or paternity leave through employers. ? Arrange for family and friends to take care of pets while you are in the hospital. ? Purchase a rear-facing car seat and make sure you know how to install it in your car. ? Pack your hospital bag. ? Prepare the baby's nursery. Make sure to remove all pillows and stuffed animals from the baby's crib to prevent suffocation.  Visit your dentist if you have not gone during your pregnancy. Use a soft toothbrush to brush your teeth and be gentle when you floss. Contact a health care provider if:  You are unsure if you are in labor or if your water has broken.  You become dizzy.  You have mild pelvic cramps, pelvic pressure, or nagging pain in your abdominal area.  You have lower back pain.  You have persistent nausea, vomiting, or diarrhea.   You have an unusual or bad smelling vaginal discharge.  You have pain when you urinate. Get help right away if:  Your water breaks before 37 weeks.  You have regular contractions less than 5 minutes apart before 37 weeks.  You have a fever.  You are leaking fluid from your vagina.  You have spotting or bleeding from your vagina.  You have severe abdominal pain or cramping.  You have rapid weight loss or weight gain.  You have   shortness of breath with chest pain.  You notice sudden or extreme swelling of your face, hands, ankles, feet, or legs.  Your baby makes fewer than 10 movements in 2 hours.  You have severe headaches that do not go away when you take medicine.  You have vision changes. Summary  The third trimester is from week 28 through week 40, months 7 through 9. The third trimester is a time when the unborn baby (fetus) is growing rapidly.  During the third trimester, your discomfort may increase as you and your baby continue to gain weight. You may have abdominal, leg, and back pain, sleeping problems, and an increased need to urinate.  During the third trimester your breasts will keep growing and they will continue to become tender. A yellow fluid (colostrum) may leak from your breasts. This is the first milk you are producing for your baby.  False labor is a condition in which you feel small, irregular tightenings of the muscles in the womb (contractions) that eventually go away. These are called Braxton Hicks contractions. Contractions may last for hours, days, or even weeks before true labor sets in.  Signs of labor can include: abdominal cramps; regular contractions that start at 10 minutes apart and become stronger and more frequent with time; watery or bloody mucus discharge that comes from the vagina; increased pelvic pressure and dull back pain; and leaking of amniotic fluid. This information is not intended to replace advice given to you by your health  care provider. Make sure you discuss any questions you have with your health care provider. Document Released: 02/10/2001 Document Revised: 06/09/2018 Document Reviewed: 03/24/2016 Elsevier Patient Education  2020 [employer]  

## 2018-11-30 NOTE — Progress Notes (Signed)
28 week labs today. No vb. No lof.  

## 2018-11-30 NOTE — Progress Notes (Signed)
  Routine Prenatal Care Visit  Subjective  Jenna Marks is a 23 y.o. G2P1001 at [redacted]w[redacted]d being seen today for ongoing prenatal care.  She is currently monitored for the following issues for this low-risk pregnancy and has Supervision of other normal pregnancy, antepartum and Pain of left hip joint on their problem list.  ----------------------------------------------------------------------------------- Patient reports no complaints. She desires tubal ligation for permanent sterility. She has thoughtfully considered this decision. Initial counseling done today. Advised to discuss further prior to final decision. Medicaid consent signed today.  Contractions: Not present. Vag. Bleeding: None.  Movement: Present. Leaking Fluid denies.  ----------------------------------------------------------------------------------- The following portions of the patient's history were reviewed and updated as appropriate: allergies, current medications, past family history, past medical history, past social history, past surgical history and problem list. Problem list updated.  Objective  Blood pressure 118/74, weight 171 lb (77.6 kg), last menstrual period 05/24/2018, not currently breastfeeding. Pregravid weight 156 lb (70.8 kg) Total Weight Gain 15 lb (6.804 kg) Urinalysis: Urine Protein    Urine Glucose    Fetal Status: Fetal Heart Rate (bpm): 145 Fundal Height: 27 cm Movement: Present     General:  Alert, oriented and cooperative. Patient is in no acute distress.  Skin: Skin is warm and dry. No rash noted.   Cardiovascular: Normal heart rate noted  Respiratory: Normal respiratory effort, no problems with respiration noted  Abdomen: Soft, gravid, appropriate for gestational age. Pain/Pressure: Absent     Pelvic:  Cervical exam deferred        Extremities: Normal range of motion.     Mental Status: Normal mood and affect. Normal behavior. Normal judgment and thought content.   Assessment   23 y.o.  G2P1001 at [redacted]w[redacted]d by  02/28/2019, by Last Menstrual Period presenting for routine prenatal visit  Plan   pregnancy2 Problems (from 05/24/18 to present)    Problem Noted Resolved   Supervision of other normal pregnancy, antepartum 07/05/2018 by Rod Can, CNM No   Overview Addendum 09/07/2018 10:02 AM by Gae Dry, MD    [facility] Prenatal Labs  Dating [place] Blood type: O/Positive/-- (06/10 0939)   Genetic Screen NIPS: neg XX Antibody:Negative (06/10 0939)  Anatomic [place]  Rubella: 1.73 (06/10 0939) Varicella: NI  GTT    Third trimester:  RPR: Non Reactive (06/10 0939)   Rhogam n/a HBsAg: Negative (06/10 0939)   TDaP vaccine  Flu Shot: HIV: Non Reactive (06/10 0939)   Baby Food                                GBS:   Contraception  Pap:  CBB     CS/VBAC NA   Support Person [place]              Preterm labor symptoms and general obstetric precautions including but not limited to vaginal bleeding, contractions, leaking of fluid and fetal movement were reviewed in detail with the patient. Please refer to After Visit Summary for other counseling recommendations.   Return in about 2 weeks (around 12/14/2018) for rob.  Rod Can, CNM 11/30/2018 10:15 AM

## 2018-12-01 LAB — 28 WEEK RH+PANEL
Basophils Absolute: 0 10*3/uL (ref 0.0–0.2)
Basos: 0 %
EOS (ABSOLUTE): 0.2 10*3/uL (ref 0.0–0.4)
Eos: 2 %
Gestational Diabetes Screen: 109 mg/dL (ref 65–139)
HIV Screen 4th Generation wRfx: NONREACTIVE
Hematocrit: 32.3 % — ABNORMAL LOW (ref 34.0–46.6)
Hemoglobin: 11 g/dL — ABNORMAL LOW (ref 11.1–15.9)
Immature Grans (Abs): 0.2 10*3/uL — ABNORMAL HIGH (ref 0.0–0.1)
Immature Granulocytes: 1 %
Lymphocytes Absolute: 1.4 10*3/uL (ref 0.7–3.1)
Lymphs: 13 %
MCH: 29 pg (ref 26.6–33.0)
MCHC: 34.1 g/dL (ref 31.5–35.7)
MCV: 85 fL (ref 79–97)
Monocytes Absolute: 0.5 10*3/uL (ref 0.1–0.9)
Monocytes: 4 %
Neutrophils Absolute: 8.2 10*3/uL — ABNORMAL HIGH (ref 1.4–7.0)
Neutrophils: 80 %
Platelets: 242 10*3/uL (ref 150–450)
RBC: 3.79 x10E6/uL (ref 3.77–5.28)
RDW: 14.1 % (ref 11.7–15.4)
RPR Ser Ql: NONREACTIVE
WBC: 10.5 10*3/uL (ref 3.4–10.8)

## 2018-12-14 ENCOUNTER — Ambulatory Visit (INDEPENDENT_AMBULATORY_CARE_PROVIDER_SITE_OTHER): Payer: Medicaid Other | Admitting: Certified Nurse Midwife

## 2018-12-14 ENCOUNTER — Other Ambulatory Visit: Payer: Self-pay

## 2018-12-14 VITALS — BP 126/50 | Wt 174.0 lb

## 2018-12-14 DIAGNOSIS — Z3A29 29 weeks gestation of pregnancy: Secondary | ICD-10-CM

## 2018-12-14 DIAGNOSIS — Z23 Encounter for immunization: Secondary | ICD-10-CM | POA: Diagnosis not present

## 2018-12-14 DIAGNOSIS — Z3480 Encounter for supervision of other normal pregnancy, unspecified trimester: Secondary | ICD-10-CM

## 2018-12-14 DIAGNOSIS — Z3483 Encounter for supervision of other normal pregnancy, third trimester: Secondary | ICD-10-CM

## 2018-12-14 LAB — POCT URINALYSIS DIPSTICK OB
Glucose, UA: NEGATIVE
POC,PROTEIN,UA: NEGATIVE

## 2018-12-14 NOTE — Progress Notes (Signed)
No complaints. rj 

## 2018-12-18 NOTE — Progress Notes (Signed)
ROB at 29wk1d: Doing well. Baby active. Wants to breast feed. Given Ready, Set Baby information today Hemoglobin 11 gm/dl-continue prenatal vitamins Flu shot today ROB in 2 weeks, TDAP at that time  Dalia Heading, CNM

## 2018-12-28 ENCOUNTER — Encounter: Payer: Self-pay | Admitting: Advanced Practice Midwife

## 2018-12-28 ENCOUNTER — Other Ambulatory Visit: Payer: Self-pay

## 2018-12-28 ENCOUNTER — Ambulatory Visit (INDEPENDENT_AMBULATORY_CARE_PROVIDER_SITE_OTHER): Payer: Medicaid Other | Admitting: Advanced Practice Midwife

## 2018-12-28 VITALS — BP 122/70 | Wt 177.0 lb

## 2018-12-28 DIAGNOSIS — Z23 Encounter for immunization: Secondary | ICD-10-CM

## 2018-12-28 DIAGNOSIS — Z3483 Encounter for supervision of other normal pregnancy, third trimester: Secondary | ICD-10-CM

## 2018-12-28 DIAGNOSIS — Z3A31 31 weeks gestation of pregnancy: Secondary | ICD-10-CM

## 2018-12-28 NOTE — Progress Notes (Signed)
No vb. No lof. TDAP today.  °

## 2018-12-28 NOTE — Progress Notes (Signed)
  Routine Prenatal Care Visit  Subjective  Jenna Marks is a 23 y.o. G2P1001 at [redacted]w[redacted]d being seen today for ongoing prenatal care.  She is currently monitored for the following issues for this low-risk pregnancy and has Supervision of other normal pregnancy, antepartum and Pain of left hip joint on their problem list.  ----------------------------------------------------------------------------------- Patient reports backache.   Contractions: Not present. Vag. Bleeding: None.  Movement: Present. Leaking Fluid denies.  ----------------------------------------------------------------------------------- The following portions of the patient's history were reviewed and updated as appropriate: allergies, current medications, past family history, past medical history, past social history, past surgical history and problem list. Problem list updated.  Objective  Blood pressure 122/70, weight 177 lb (80.3 kg), last menstrual period 05/24/2018, not currently breastfeeding. Pregravid weight 156 lb (70.8 kg) Total Weight Gain 21 lb (9.526 kg) Urinalysis: Urine Protein    Urine Glucose    Fetal Status: Fetal Heart Rate (bpm): 147 Fundal Height: 31 cm Movement: Present     General:  Alert, oriented and cooperative. Patient is in no acute distress.  Skin: Skin is warm and dry. No rash noted.   Cardiovascular: Normal heart rate noted  Respiratory: Normal respiratory effort, no problems with respiration noted  Abdomen: Soft, gravid, appropriate for gestational age. Pain/Pressure: Absent     Pelvic:  Cervical exam deferred        Extremities: Normal range of motion.  Edema: None  Mental Status: Normal mood and affect. Normal behavior. Normal judgment and thought content.   Assessment   23 y.o. G2P1001 at [redacted]w[redacted]d by  02/28/2019, by Last Menstrual Period presenting for routine prenatal visit  Plan   pregnancy2 Problems (from 05/24/18 to present)    Problem Noted Resolved   Supervision of other  normal pregnancy, antepartum 07/05/2018 by Rod Can, CNM No   Overview Addendum 12/18/2018  1:17 PM by Dalia Heading, [facility] Prenatal Labs  Dating [place] Blood type: O/Positive/-- (06/10 0939)   Genetic Screen NIPS: neg XX Antibody:Negative (06/10 0939)  Anatomic [place]  Rubella: 1.73 (06/10 0939) Varicella: NI  GTT    Third trimester: 109 RPR: Non Reactive (06/10 0939)   Rhogam n/a HBsAg: Negative (06/10 0939)   TDaP vaccine  Flu Shot: 12/14/18 HIV: Non Reactive (06/10 0939)   Baby Food      Breast                          GBS:   Contraception  Pap:  CBB     CS/VBAC NA   Support Person [place]              Preterm labor symptoms and general obstetric precautions including but not limited to vaginal bleeding, contractions, leaking of fluid and fetal movement were reviewed in detail with the patient. Backache: stretches/exercise, heat/ice, soak in tub, abdominal support band  Return in about 2 weeks (around 01/11/2019) for rob.  Rod Can, CNM 12/28/2018 11:39 AM

## 2018-12-30 IMAGING — US US OB COMP +14 WK
1 series · 13 of 28 positions shown · non-contrast
Comparison: none

CLINICAL DATA: 20-year-old gravida 1 para 0.  Scan for anatomy.

By certain LMP, EDC is 06/23/2017.
By report of the first ultrasound, EDC is 05/03/2017, per
physician's notes in [place].
EXAM:
OBSTETRICAL ULTRASOUND >14 WKS

[Series 1: us ob comp +14 wk · 0.23mm/px · 13 of 89 slices shown]
[im 4/89]
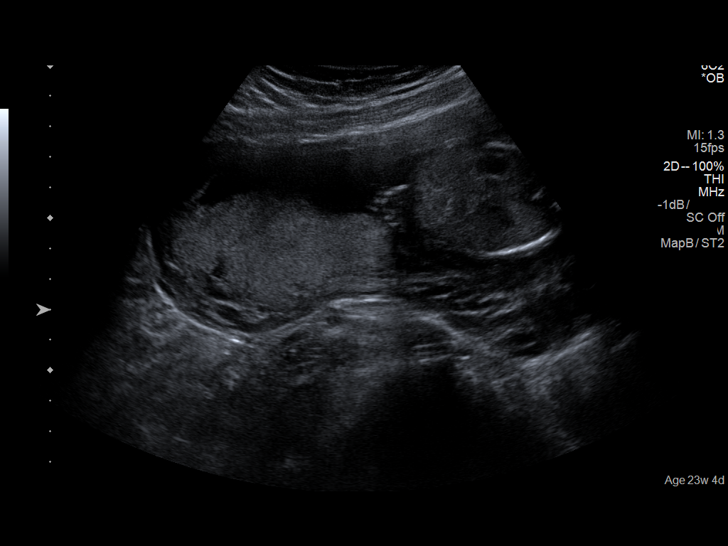
[im 10/89]
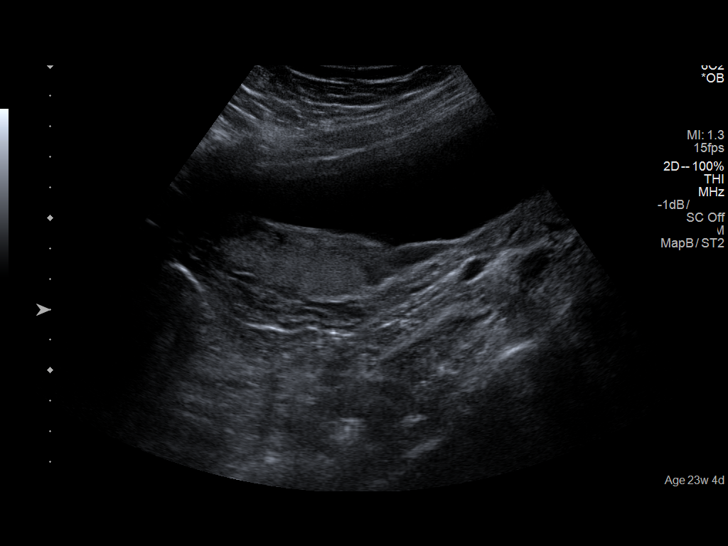
[im 17/89]
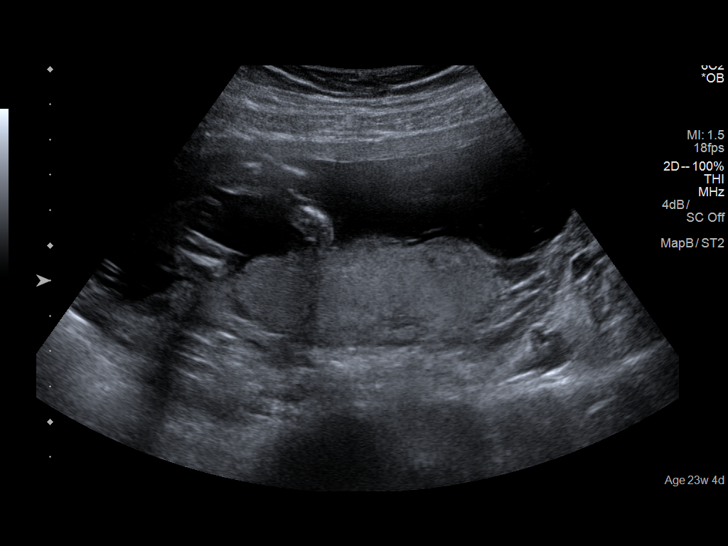
[im 23/89]
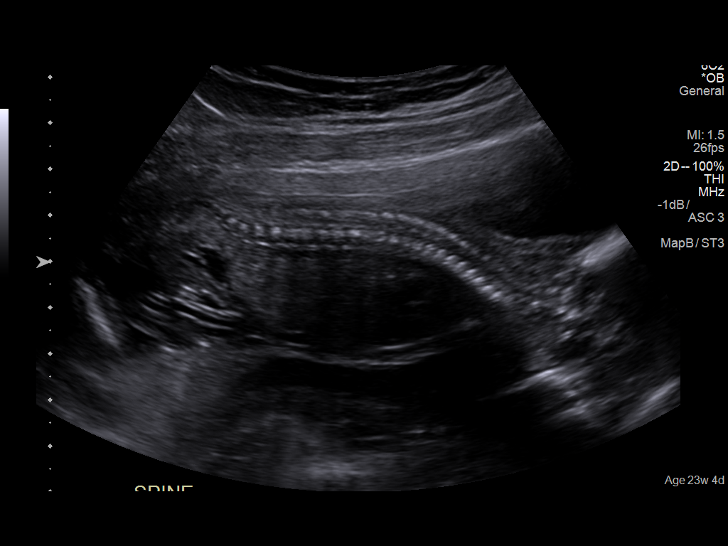
[im 30/89]
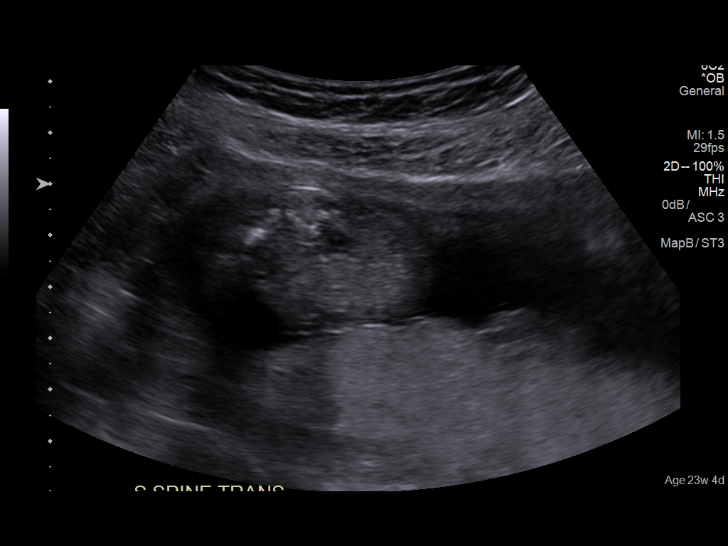
[im 36/89]
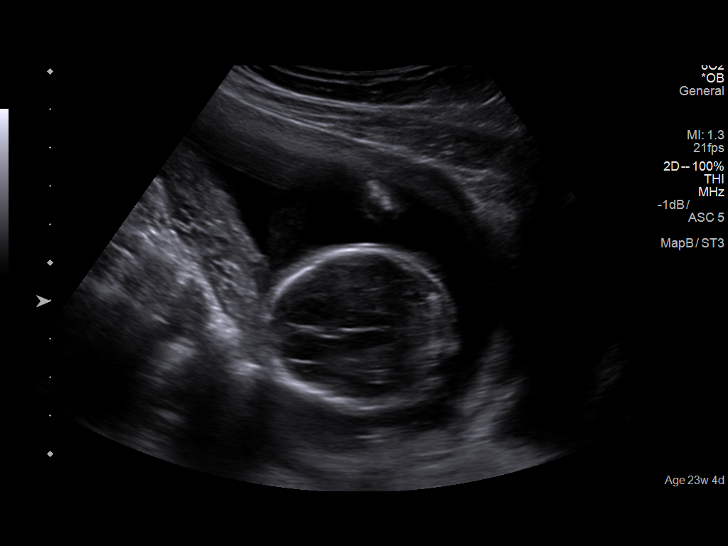
[im 46/89]
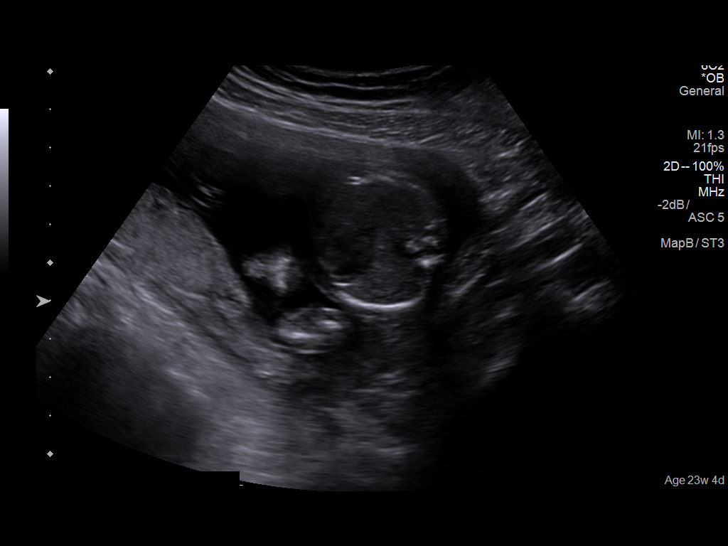
[im 53/89]
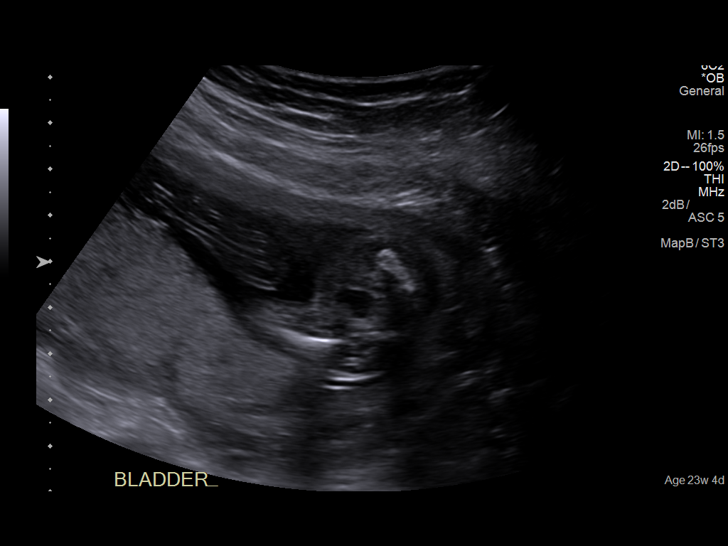
[im 59/89]
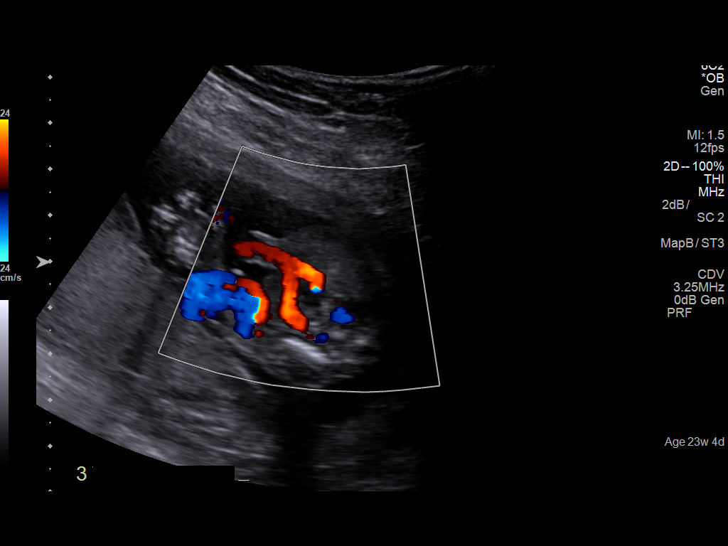
[im 66/89]
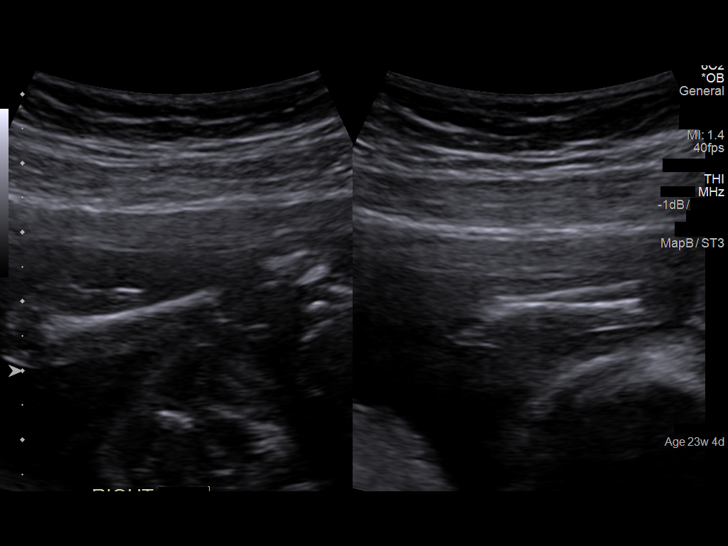
[im 72/89]
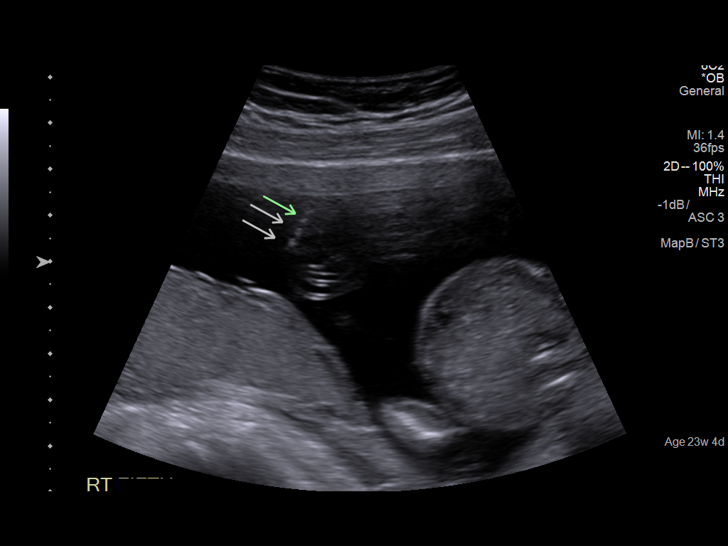
[im 79/89]
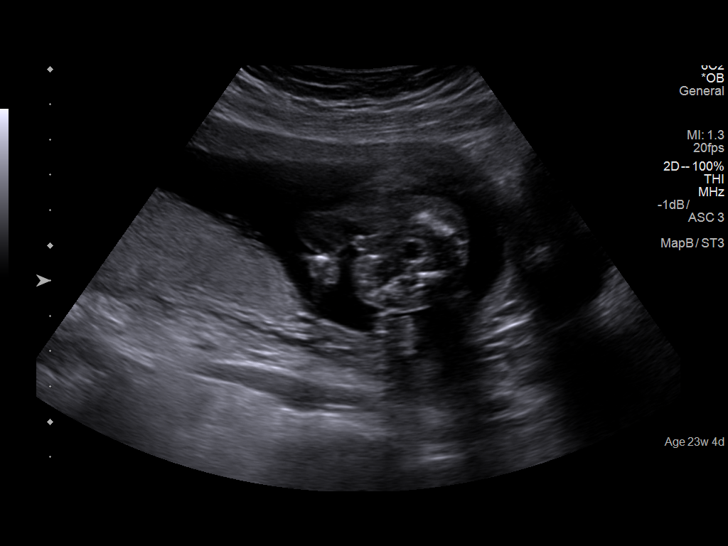
[im 85/89]
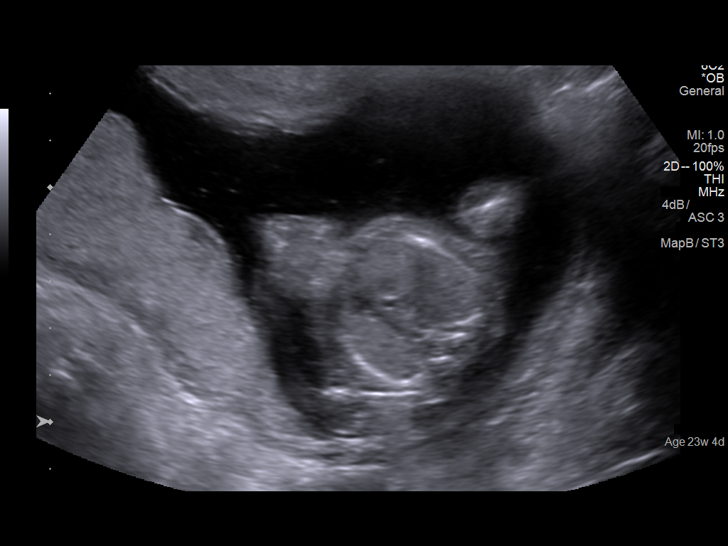

[13 of 28 positions shown; findings below may reference images not displayed]

FINDINGS: Number of Fetuses: 1

Heart Rate:  147 bpm

Movement: Present

Presentation: Variable

Previa: None

Placental Location: Posterior

Amniotic Fluid (Subjective): Normal

Amniotic Fluid (Objective):

Vertical pocket 2.3cm

FETAL BIOMETRY

BPD:  4.1cm 18w 2d

HC:    15.2cm  18w   1d

AC:   12.4cm  18w   0d

FL:   2.53cm  17w   5d

Current Mean GA: 18w 0d              US EDC: 06/11/2017

FETAL ANATOMY

Lateral Ventricles: Appears normal

Thalami/CSP: Appears normal

Posterior Fossa:  Appears normal

Nuchal Region: Appears normal    NFT= 3.2mm

Upper Lip: Appears normal

Spine: Appears normal

4 Chamber Heart on Left: Appears normal

LVOT: Not well visualized

RVOT: Not well visualized

Stomach on Left: Appears normal

3 Vessel Cord: Appears normal

Cord Insertion site: Appears normal

Kidneys: Appears normal

Bladder: Appears normal

Extremities: Appears normal

Sex: Male

Technically difficult due to: Fetal position

Maternal Findings:

Cervix:  3.9 cm
IMPRESSION: 1. Single living intrauterine fetus .
2. Discrepancy between today's measurements, clinical dating, and
reported first ultrasound dating. Correlation with dating criteria
if needed.
3. By today's exam, EDC is 06/11/2017.
4. No fetal anomalies are identified.

## 2019-01-11 ENCOUNTER — Other Ambulatory Visit: Payer: Self-pay

## 2019-01-11 ENCOUNTER — Ambulatory Visit (INDEPENDENT_AMBULATORY_CARE_PROVIDER_SITE_OTHER): Payer: Medicaid Other | Admitting: Certified Nurse Midwife

## 2019-01-11 VITALS — BP 118/60 | Wt 182.0 lb

## 2019-01-11 DIAGNOSIS — Z3A33 33 weeks gestation of pregnancy: Secondary | ICD-10-CM

## 2019-01-11 DIAGNOSIS — Z3480 Encounter for supervision of other normal pregnancy, unspecified trimester: Secondary | ICD-10-CM

## 2019-01-11 DIAGNOSIS — O26893 Other specified pregnancy related conditions, third trimester: Secondary | ICD-10-CM

## 2019-01-11 DIAGNOSIS — R300 Dysuria: Secondary | ICD-10-CM

## 2019-01-11 LAB — POCT URINALYSIS DIPSTICK
Bilirubin, UA: NEGATIVE
Blood, UA: NEGATIVE
Glucose, UA: POSITIVE — AB
Nitrite, UA: NEGATIVE
Protein, UA: POSITIVE — AB
Spec Grav, UA: 1.015 (ref 1.010–1.025)
Urobilinogen, UA: NEGATIVE E.U./dL — AB
pH, UA: 6.5 (ref 5.0–8.0)

## 2019-01-11 LAB — POCT URINALYSIS DIPSTICK OB

## 2019-01-11 MED ORDER — CEPHALEXIN 500 MG PO CAPS: 500.0000 mg | ORAL_CAPSULE | Freq: Three times a day (TID) | ORAL | 0 refills | Status: DC

## 2019-01-11 NOTE — Progress Notes (Signed)
C/o sharp stabbing pain when she voids. rj

## 2019-01-13 LAB — URINE CULTURE

## 2019-01-15 NOTE — Progress Notes (Signed)
ROB at 33wk1d: Complains of some dysuria, no hematuria. Baby active. No vaginal bleeding or regular contractions. Initial BP elevated. Denies headahces, visual changes, nausea/vomiting. BP 140/66 initially, then 118/60. Trace protein and leukocytes on urine dipstick. FHT 152, FH 33cm  A: IUP at 33.1 weeks R/O UTI  P: Keflex 500 mgm tid x 7 days Urine culture sent [facility] instructions ROB in 2 weeks  Dalia Heading, CNM  Dalia Heading, [place]

## 2019-01-24 ENCOUNTER — Other Ambulatory Visit: Payer: Self-pay

## 2019-01-24 ENCOUNTER — Encounter: Payer: Self-pay | Admitting: Advanced Practice Midwife

## 2019-01-24 ENCOUNTER — Ambulatory Visit (INDEPENDENT_AMBULATORY_CARE_PROVIDER_SITE_OTHER): Payer: Medicaid Other | Admitting: Advanced Practice Midwife

## 2019-01-24 VITALS — BP 120/80 | Wt 181.0 lb

## 2019-01-24 DIAGNOSIS — O26893 Other specified pregnancy related conditions, third trimester: Secondary | ICD-10-CM

## 2019-01-24 DIAGNOSIS — Z3A35 35 weeks gestation of pregnancy: Secondary | ICD-10-CM

## 2019-01-24 DIAGNOSIS — R300 Dysuria: Secondary | ICD-10-CM

## 2019-01-24 LAB — POCT URINALYSIS DIPSTICK
Bilirubin, UA: NEGATIVE
Blood, UA: NEGATIVE
Glucose, UA: NEGATIVE
Ketones, UA: NEGATIVE
Nitrite, UA: NEGATIVE
Protein, UA: NEGATIVE
Spec Grav, UA: 1.01 (ref 1.010–1.025)
Urobilinogen, UA: 0.2 E.U./dL
pH, UA: 5 (ref 5.0–8.0)

## 2019-01-24 LAB — POCT URINALYSIS DIPSTICK OB
Glucose, UA: NEGATIVE
POC,PROTEIN,UA: NEGATIVE

## 2019-01-24 NOTE — Progress Notes (Signed)
  Routine Prenatal Care Visit  Subjective  Jenna Marks is a 23 y.o. G2P1001 at [redacted]w[redacted]d being seen today for ongoing prenatal care.  She is currently monitored for the following issues for this low-risk pregnancy and has Supervision of other normal pregnancy, antepartum and Pain of left hip joint on their problem list.  ----------------------------------------------------------------------------------- Patient reports a sharp pain with urination. She denies burning, frequency or urgency. Most recent urine culture was negative.   Contractions: Not present. Vag. Bleeding: None.  Movement: Present. Leaking Fluid denies.  ----------------------------------------------------------------------------------- The following portions of the patient's history were reviewed and updated as appropriate: allergies, current medications, past family history, past medical history, past social history, past surgical history and problem list. Problem list updated.  Objective  Blood pressure 120/80, weight 181 lb (82.1 kg), last menstrual period 05/24/2018, not currently breastfeeding. Pregravid weight 156 lb (70.8 kg) Total Weight Gain 25 lb (11.3 kg) Urinalysis: Urine Protein Negative  Urine Glucose Negative  UA with small leukocytes and otherwise unremarkable (similar to UA 2 weeks ago)   Fetal Status: Fetal Heart Rate (bpm): 154 Fundal Height: 35 cm Movement: Present     General:  Alert, oriented and cooperative. Patient is in no acute distress.  Skin: Skin is warm and dry. No rash noted.   Cardiovascular: Normal heart rate noted  Respiratory: Normal respiratory effort, no problems with respiration noted  Abdomen: Soft, gravid, appropriate for gestational age. Pain/Pressure: Present     Pelvic:  Cervical exam deferred        Extremities: Normal range of motion.  Edema: None  Mental Status: Normal mood and affect. Normal behavior. Normal judgment and thought content.   Assessment   23 y.o. G2P1001 at  [redacted]w[redacted]d by  02/28/2019, by Last Menstrual Period presenting for routine prenatal visit  Plan   pregnancy2 Problems (from 05/24/18 to present)    Problem Noted Resolved   Supervision of other normal pregnancy, antepartum 07/05/2018 by Rod Can, CNM No   Overview Addendum 12/18/2018  1:17 PM by Dalia Heading, [facility] Prenatal Labs  Dating [place] Blood type: O/Positive/-- (06/10 0939)   Genetic Screen NIPS: neg XX Antibody:Negative (06/10 0939)  Anatomic [place]  Rubella: 1.73 (06/10 0939) Varicella: NI  GTT    Third trimester: 109 RPR: Non Reactive (06/10 0939)   Rhogam n/a HBsAg: Negative (06/10 0939)   TDaP vaccine  Flu Shot: 12/14/18 HIV: Non Reactive (06/10 0939)   Baby Food      Breast                          GBS:   Contraception  Pap:  CBB     CS/VBAC NA   Support Person [place]              Preterm labor symptoms and general obstetric precautions including but not limited to vaginal bleeding, contractions, leaking of fluid and fetal movement were reviewed in detail with the patient.    Return in about 1 week (around 01/31/2019) for rob.  Rod Can, CNM 01/24/2019 10:21 AM

## 2019-01-28 IMAGING — US US EXTREM LOW VENOUS*R*
1 series · 13 of 24 positions shown · non-contrast
Comparison: None

CLINICAL DATA: RIGHT lower extremity pitting edema and erythema

EXAM:
Bright LOWER EXTREMITY VENOUS DOPPLER ULTRASOUND
TECHNIQUE: Gray-scale sonography with graded compression, as well as color
Doppler and duplex ultrasound were performed to evaluate the lower
extremity deep venous systems from the level of the common femoral
vein and including the common femoral, femoral, profunda femoral,
popliteal and calf veins including the posterior tibial, peroneal
and gastrocnemius veins when visible. The superficial great
saphenous vein was also interrogated. Spectral Doppler was utilized
to evaluate flow at rest and with distal augmentation maneuvers in
the common femoral, femoral and popliteal veins.

[Series 1: us extrem low venous*right* · 0.08mm/px · 13 of 36 slices shown]
[im 1/36]
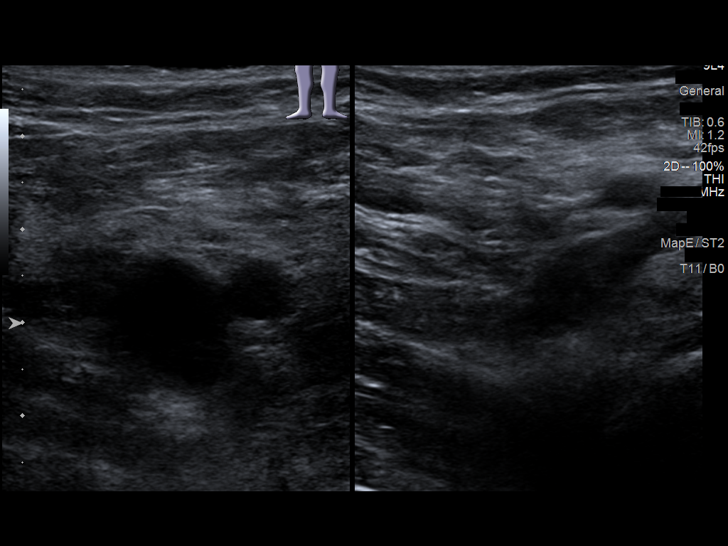
[im 4/36]
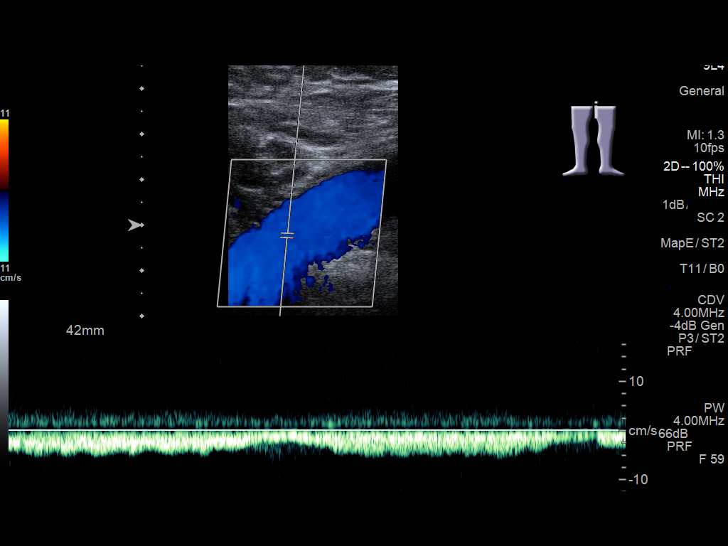
[im 7/36]
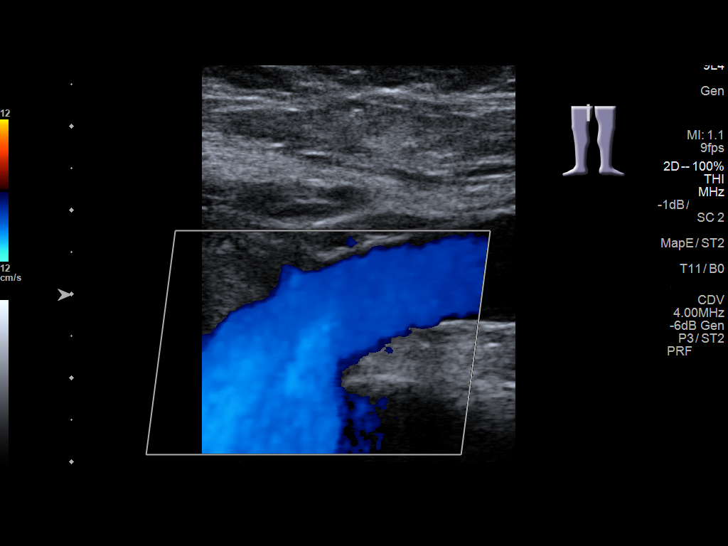
[im 10/36]
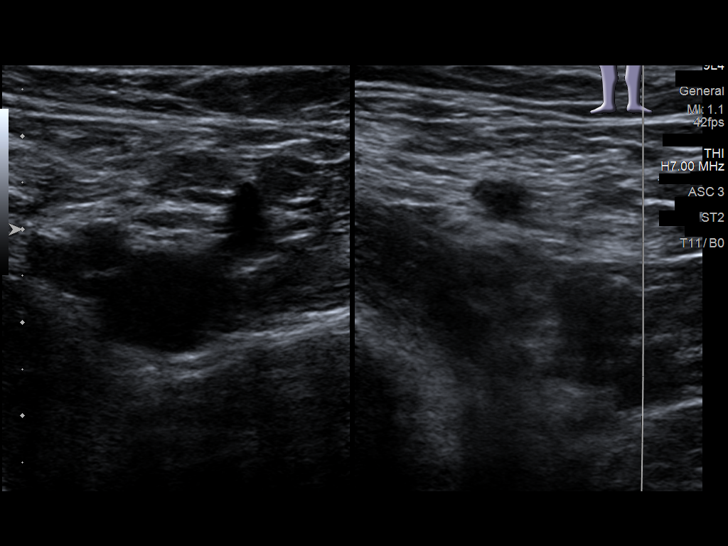
[im 13/36]
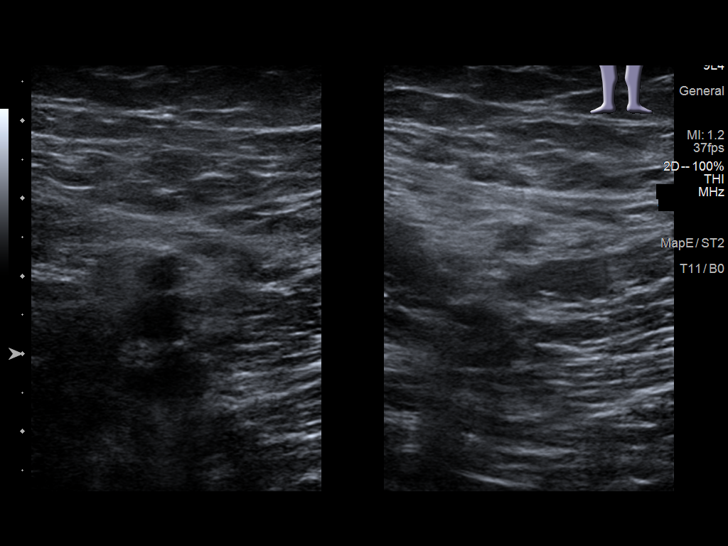
[im 16/36]
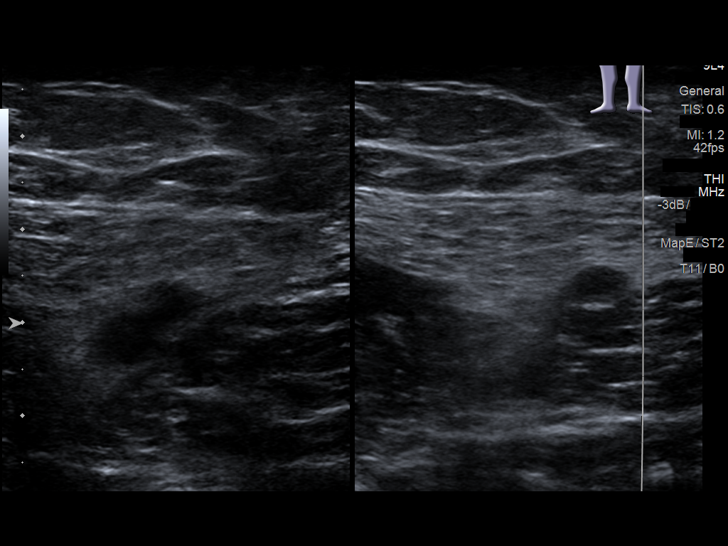
[im 19/36]
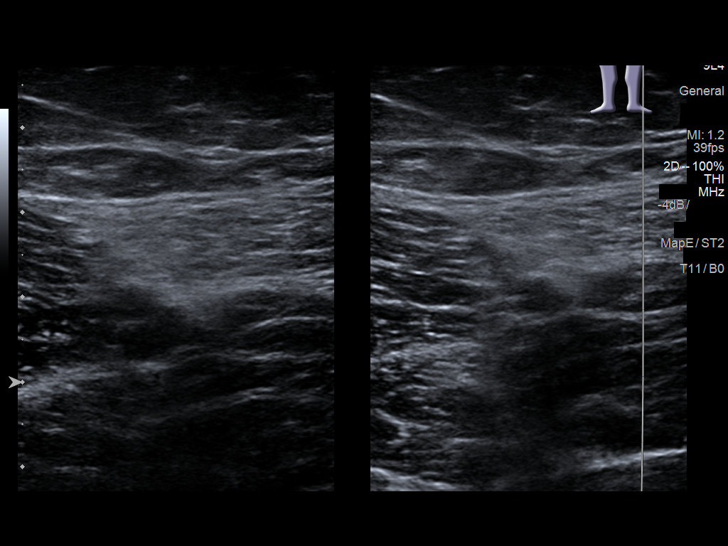
[im 20/36]
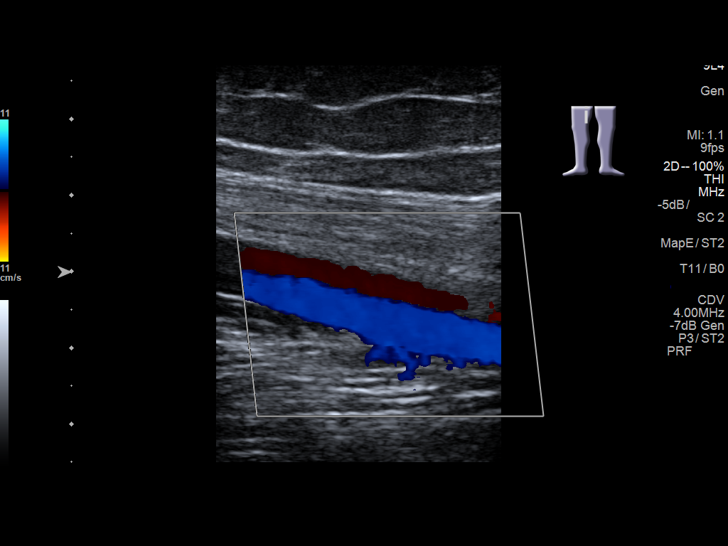
[im 23/36]
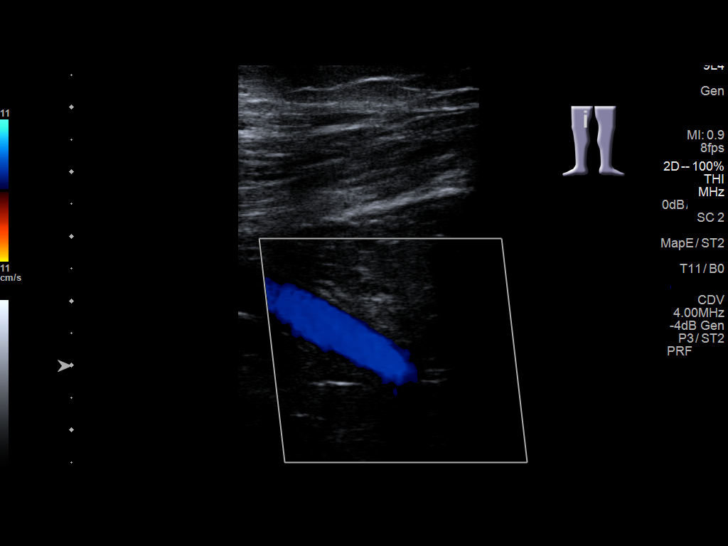
[im 26/36]
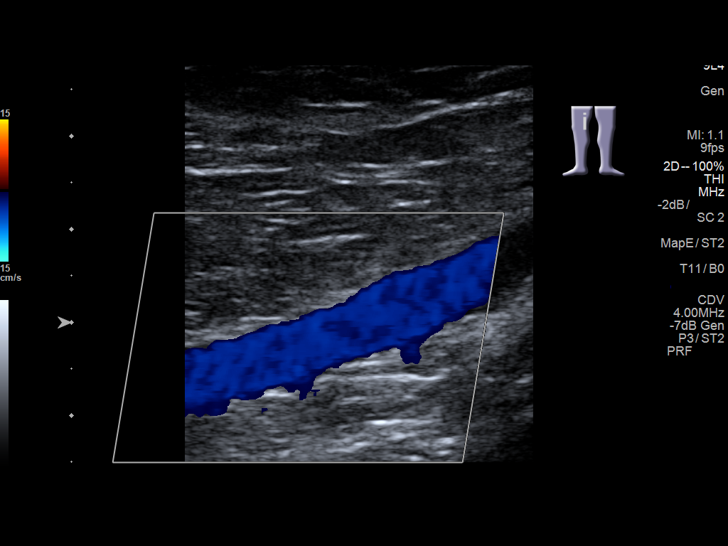
[im 29/36]
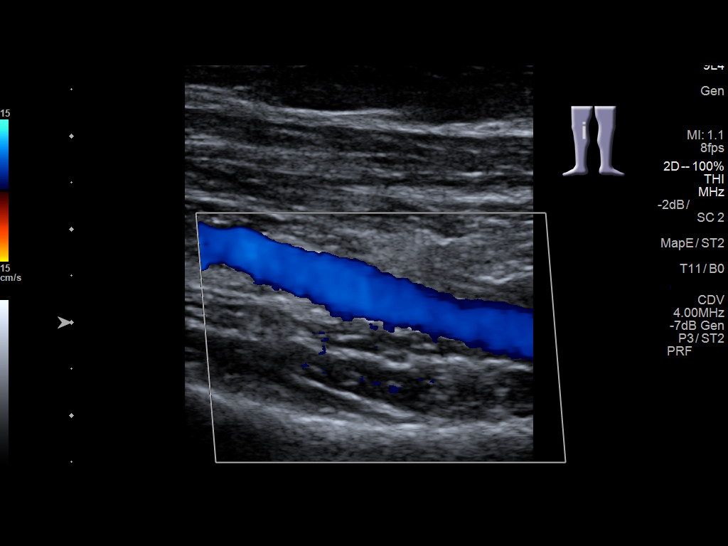
[im 32/36]
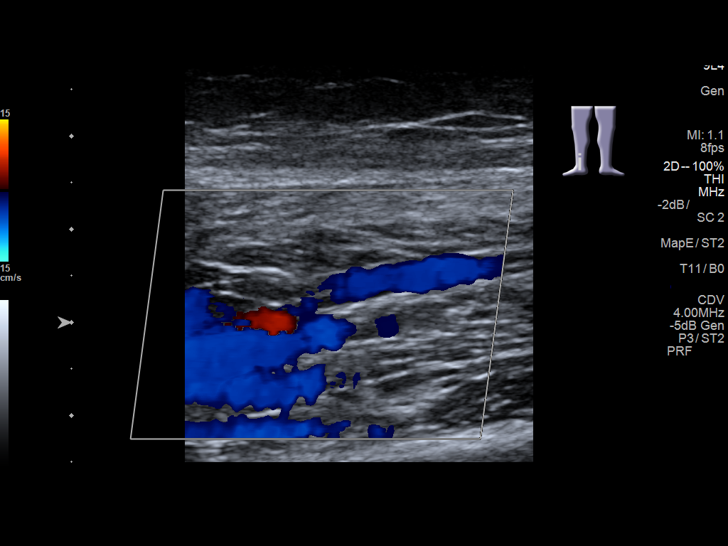
[im 36/36]
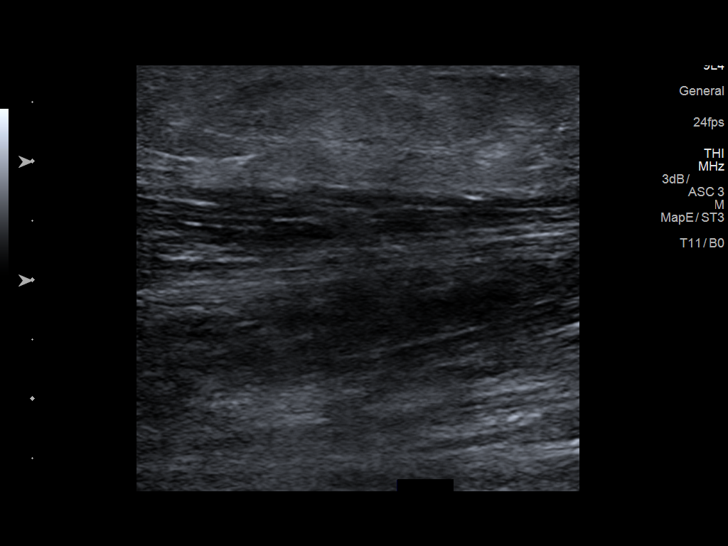

[13 of 24 positions shown; findings below may reference images not displayed]

FINDINGS: Contralateral Common Femoral Vein: Respiratory phasicity is normal
and symmetric with the symptomatic side. No evidence of thrombus.
Normal compressibility.

Common Femoral Vein: No evidence of thrombus. Normal
compressibility, respiratory phasicity and response to augmentation.

Saphenofemoral Junction: No evidence of thrombus. Normal
compressibility and flow on color Doppler imaging.

Profunda Femoral Vein: No evidence of thrombus. Normal
compressibility and flow on color Doppler imaging.

Femoral Vein: No evidence of thrombus. Normal compressibility,
respiratory phasicity and response to augmentation.

Popliteal Vein: No evidence of thrombus. Normal compressibility,
respiratory phasicity and response to augmentation.

Calf Veins: No evidence of thrombus. Normal compressibility and flow
on color Doppler imaging.

Superficial Great Saphenous Vein: No evidence of thrombus. Normal
compressibility.

Venous Reflux:  None.

Other Findings:  None.
IMPRESSION: No evidence of deep venous thrombosis in the RIGHT lower extremity.

## 2019-01-31 ENCOUNTER — Other Ambulatory Visit (HOSPITAL_COMMUNITY)
Admission: RE | Admit: 2019-01-31 | Discharge: 2019-01-31 | Disposition: A | Discharge status: Home / Self Care | Payer: Medicaid Other | Source: Ambulatory Visit | Attending: Certified Nurse Midwife | Admitting: Certified Nurse Midwife

## 2019-01-31 ENCOUNTER — Ambulatory Visit (INDEPENDENT_AMBULATORY_CARE_PROVIDER_SITE_OTHER): Payer: Medicaid Other | Admitting: Certified Nurse Midwife

## 2019-01-31 ENCOUNTER — Other Ambulatory Visit: Payer: Self-pay

## 2019-01-31 VITALS — BP 138/72 | Wt 183.0 lb

## 2019-01-31 DIAGNOSIS — Z3480 Encounter for supervision of other normal pregnancy, unspecified trimester: Secondary | ICD-10-CM | POA: Diagnosis present

## 2019-01-31 DIAGNOSIS — Z113 Encounter for screening for infections with a predominantly sexual mode of transmission: Secondary | ICD-10-CM | POA: Diagnosis present

## 2019-01-31 DIAGNOSIS — Z3A36 36 weeks gestation of pregnancy: Secondary | ICD-10-CM

## 2019-01-31 DIAGNOSIS — Z3483 Encounter for supervision of other normal pregnancy, third trimester: Secondary | ICD-10-CM

## 2019-01-31 DIAGNOSIS — Z3685 Encounter for antenatal screening for Streptococcus B: Secondary | ICD-10-CM

## 2019-01-31 LAB — POCT URINALYSIS DIPSTICK OB: Glucose, UA: NEGATIVE

## 2019-01-31 LAB — OB RESULTS CONSOLE GC/CHLAMYDIA: Gonorrhea: NEGATIVE

## 2019-01-31 NOTE — Progress Notes (Signed)
ROB C/o pelvic pressure/pain Denies lof, no vb, Good FM

## 2019-01-31 NOTE — Progress Notes (Signed)
ROB at 36 weeks: DOing well. Increased pelvic pressure. Denies regular contractions, vaginal bleeding or LOF. FHTs WNL/ cephalic presentation on Leopold's Aptima and GBS cultures done 34 week precautions/ Labor precautions/ RTO in 1 week  Dalia Heading, CNM

## 2019-02-01 LAB — OB RESULTS CONSOLE GBS: GBS: NEGATIVE

## 2019-02-02 LAB — CERVICOVAGINAL ANCILLARY ONLY
Chlamydia: NEGATIVE
Comment: NEGATIVE
Comment: NORMAL
Neisseria Gonorrhea: NEGATIVE

## 2019-02-05 LAB — CULTURE, BETA STREP (GROUP B ONLY): Strep Gp B Culture: NEGATIVE

## 2019-02-07 ENCOUNTER — Other Ambulatory Visit: Payer: Self-pay

## 2019-02-07 ENCOUNTER — Encounter: Payer: Self-pay | Admitting: Advanced Practice Midwife

## 2019-02-07 ENCOUNTER — Ambulatory Visit (INDEPENDENT_AMBULATORY_CARE_PROVIDER_SITE_OTHER): Payer: Medicaid Other | Admitting: Advanced Practice Midwife

## 2019-02-07 VITALS — BP 128/74 | Wt 186.0 lb

## 2019-02-07 DIAGNOSIS — Z3483 Encounter for supervision of other normal pregnancy, third trimester: Secondary | ICD-10-CM

## 2019-02-07 DIAGNOSIS — Z3A37 37 weeks gestation of pregnancy: Secondary | ICD-10-CM

## 2019-02-07 NOTE — Progress Notes (Signed)
No vb. No lof.  

## 2019-02-07 NOTE — Patient Instructions (Signed)
Braxton Hicks Contractions Contractions of the uterus can occur throughout pregnancy, but they are not always a sign that you are in labor. You may have practice contractions called Braxton Hicks contractions. These false labor contractions are sometimes confused with true labor. What are Braxton Hicks contractions? Braxton Hicks contractions are tightening movements that occur in the muscles of the uterus before labor. Unlike true labor contractions, these contractions do not result in opening (dilation) and thinning of the cervix. Toward the end of pregnancy (32-34 weeks), Braxton Hicks contractions can happen more often and may become stronger. These contractions are sometimes difficult to tell apart from true labor because they can be very uncomfortable. You should not feel embarrassed if you go to the hospital with false labor. Sometimes, the only way to tell if you are in true labor is for your health care provider to look for changes in the cervix. The health care provider will do a physical exam and may monitor your contractions. If you are not in true labor, the exam should show that your cervix is not dilating and your water has not broken. If there are no other health problems associated with your pregnancy, it is completely safe for you to be sent home with false labor. You may continue to have Braxton Hicks contractions until you go into true labor. How to tell the difference between true labor and false labor True labor  Contractions last 30-70 seconds.  Contractions become very regular.  Discomfort is usually felt in the top of the uterus, and it spreads to the lower abdomen and low back.  Contractions do not go away with walking.  Contractions usually become more intense and increase in frequency.  The cervix dilates and gets thinner. False labor  Contractions are usually shorter and not as strong as true labor contractions.  Contractions are usually irregular.  Contractions  are often felt in the front of the lower abdomen and in the groin.  Contractions may go away when you walk around or change positions while lying down.  Contractions get weaker and are shorter-lasting as time goes on.  The cervix usually does not dilate or become thin. Follow these instructions at home:   Take over-the-counter and prescription medicines only as told by your health care provider.  Keep up with your usual exercises and follow other instructions from your health care provider.  Eat and drink lightly if you think you are going into labor.  If Braxton Hicks contractions are making you uncomfortable: ? Change your position from lying down or resting to walking, or change from walking to resting. ? Sit and rest in a tub of warm water. ? Drink enough fluid to keep your urine pale yellow. Dehydration may cause these contractions. ? Do slow and deep breathing several times an hour.  Keep all follow-up prenatal visits as told by your health care provider. This is important. Contact a health care provider if:  You have a fever.  You have continuous pain in your abdomen. Get help right away if:  Your contractions become stronger, more regular, and closer together.  You have fluid leaking or gushing from your vagina.  You pass blood-tinged mucus (bloody show).  You have bleeding from your vagina.  You have low back pain that you never had before.  You feel your baby's head pushing down and causing pelvic pressure.  Your baby is not moving inside you as much as it used to. Summary  Contractions that occur before labor are   called Braxton Hicks contractions, false labor, or practice contractions.  Braxton Hicks contractions are usually shorter, weaker, farther apart, and less regular than true labor contractions. True labor contractions usually become progressively stronger and regular, and they become more frequent.  Manage discomfort from Braxton Hicks contractions  by changing position, resting in a warm bath, drinking plenty of water, or practicing deep breathing. This information is not intended to replace advice given to you by your health care provider. Make sure you discuss any questions you have with your health care provider. Document Released: 07/02/2016 Document Revised: 01/29/2017 Document Reviewed: 07/02/2016 Elsevier Patient Education  2020 [employer]  

## 2019-02-07 NOTE — Progress Notes (Signed)
Routine Prenatal Care Visit  Subjective  Jenna Marks is a 23 y.o. G2P1001 at [redacted]w[redacted]d being seen today for ongoing prenatal care.  She is currently monitored for the following issues for this low-risk pregnancy and has Supervision of other normal pregnancy, antepartum and Pain of left hip joint on their problem list.  ----------------------------------------------------------------------------------- Patient reports no complaints.  Patient is still certain she wants to have tubal ligation.  Contractions: Not present. Vag. Bleeding: None.  Movement: Present. Leaking Fluid denies.  ----------------------------------------------------------------------------------- The following portions of the patient's history were reviewed and updated as appropriate: allergies, current medications, past family history, past medical history, past social history, past surgical history and problem list. Problem list updated.  Objective  Blood pressure 128/74, weight 186 lb (84.4 kg), last menstrual period 05/24/2018, not currently breastfeeding. Pregravid weight 156 lb (70.8 kg) Total Weight Gain 30 lb (13.6 kg) Urinalysis: Urine Protein    Urine Glucose    Fetal Status: Fetal Heart Rate (bpm): 154 Fundal Height: 38 cm Movement: Present     General:  Alert, oriented and cooperative. Patient is in no acute distress.  Skin: Skin is warm and dry. No rash noted.   Cardiovascular: Normal heart rate noted  Respiratory: Normal respiratory effort, no problems with respiration noted  Abdomen: Soft, gravid, appropriate for gestational age. Pain/Pressure: Absent     Pelvic:  Cervical exam deferred        Extremities: Normal range of motion.  Edema: None  Mental Status: Normal mood and affect. Normal behavior. Normal judgment and thought content.   The following were addressed during this visit:  Breastfeeding Education - Early initiation of breastfeeding    Comments: Keeps milk supply adequate, helps contract  uterus and slow bleeding, and early milk is the perfect first food and is easy to digest.   - The importance of exclusive breastfeeding    Comments: Provides antibodies, Lower risk of breast and ovarian cancers, and type-2 diabetes,Helps your body recover, Reduced chance of SIDS.   - Risks of giving your baby anything other than breast milk if you are breastfeeding    Comments: Make the baby less content with breastfeeds, may make my baby more susceptible to illness, and may reduce my milk supply.   - The importance of early skin-to-skin contact    Comments: Keeps baby warm and secure, helps keep baby's blood sugar up and breathing steady, easier to bond and breastfeed, and helps calm baby.  - Rooming-in on a 24-hour basis    Comments: Easier to learn baby's feeding cues, easier to bond and get to know each other, and encourages milk production.   - Feeding on demand or baby-led feeding    Comments: Helps prevent breastfeeding complications, helps bring in good milk supply, prevents under or overfeeding, and helps baby feel content and satisfied   - Frequent feeding to help assure optimal milk production    Comments: Making a full supply of milk requires frequent removal of milk from breasts, infant will eat 8-12 times in 24 hours, if separated from infant use breast massage, hand expression and/ or pumping to remove milk from breasts.   - Effective positioning and attachment    Comments: Helps my baby to get enough breast milk, helps to produce an adequate milk supply, and helps prevent nipple pain and damage   - Exclusive breastfeeding for the first 6 months    Comments: Builds a healthy milk supply and keeps it up, protects baby from sickness and disease, and  breastmilk has everything your baby needs for the first 6 months.  - Individualized Education    Comments: Contraindications to breastfeeding and other special medical conditions    Assessment   23 y.o. G2P1001 at  [redacted]w[redacted]d by  02/28/2019, by Last Menstrual Period presenting for routine prenatal visit  Plan   pregnancy2 Problems (from 05/24/18 to present)    Problem Noted Resolved   Supervision of other normal pregnancy, antepartum 07/05/2018 by Tresea Mall, CNM No   Overview Addendum 02/06/2019  1:34 PM by Farrel Conners, CNM    Clinic [facility] Prenatal Labs  Dating [place] Blood type: O/Positive/-- (06/10 0939)   Genetic Screen NIPS: neg XX Antibody:Negative (06/10 0939)  Anatomic [place]  Rubella: 1.73 (06/10 0939) Varicella: NI  GTT    Third trimester: 109 RPR: Non Reactive (06/10 0939)   Rhogam n/a HBsAg: Negative (06/10 0939)   TDaP vaccine  10/28 Flu Shot: 12/14/18 HIV: Non Reactive (06/10 0939)   Baby Food      Breast                          GBS: negative  Contraception  Pap:  CBB     CS/VBAC NA   Support Person [place]              Term labor symptoms and general obstetric precautions including but not limited to vaginal bleeding, contractions, leaking of fluid and fetal movement were reviewed in detail with the patient. Please refer to After Visit Summary for other counseling recommendations.   Return in about 1 week (around 02/14/2019) for rob.  Tresea Mall, CNM 02/07/2019 2:05 PM

## 2019-02-08 ENCOUNTER — Observation Stay
Admission: EM | Admit: 2019-02-08 | Discharge: 2019-02-08 | Disposition: A | Discharge status: Home / Self Care | Payer: Medicaid Other | Attending: Obstetrics and Gynecology | Admitting: Obstetrics and Gynecology

## 2019-02-08 ENCOUNTER — Telehealth: Payer: Self-pay | Admitting: Obstetrics and Gynecology

## 2019-02-08 ENCOUNTER — Other Ambulatory Visit: Payer: Self-pay

## 2019-02-08 DIAGNOSIS — O26893 Other specified pregnancy related conditions, third trimester: Principal | ICD-10-CM | POA: Insufficient documentation

## 2019-02-08 DIAGNOSIS — R102 Pelvic and perineal pain: Secondary | ICD-10-CM | POA: Insufficient documentation

## 2019-02-08 DIAGNOSIS — Z3A37 37 weeks gestation of pregnancy: Secondary | ICD-10-CM | POA: Diagnosis not present

## 2019-02-08 DIAGNOSIS — Z3480 Encounter for supervision of other normal pregnancy, unspecified trimester: Secondary | ICD-10-CM

## 2019-02-08 LAB — URINALYSIS, COMPLETE (UACMP) WITH MICROSCOPIC
Bilirubin Urine: NEGATIVE
Glucose, UA: NEGATIVE mg/dL
Ketones, ur: NEGATIVE mg/dL
Nitrite: NEGATIVE
Protein, ur: NEGATIVE mg/dL
Specific Gravity, Urine: 1.011 (ref 1.005–1.030)
pH: 7 (ref 5.0–8.0)

## 2019-02-08 NOTE — OB Triage Note (Addendum)
Pt is a 23 y.o. G2P1 that presents from ED with c/o sharp constant pelvic pressure that started this morning. Pt denies VB, LOF and states positive FM. Monitors applied and initial fht 145. Pt states " I never felt contractions with my first baby and I was 5cm when I got here and then didn't get an epidural because I wasn't hurting." Pt c/o occasional back pain that feels like menstrual cramps. Pt was seen by PT in June for 6-8 weeks for pelvic pressure. Pt states she has had "pressure pain when I'm peeing" for the last month.

## 2019-02-08 NOTE — Telephone Encounter (Signed)
Patient is calling with sharp vaginal and abdominal pain that is not going away. No vaginal bleeding. No opening today. Please advise work in

## 2019-02-08 NOTE — Telephone Encounter (Signed)
Attempt to reach patient no on phone busy unable to reach patient

## 2019-02-08 NOTE — OB Triage Note (Signed)
Provider placed orders for discharge. Instructions reviewed and patient has no further questions at this time. Pt discharged in stable condition with significant other and informed when to return to the hospital or call her provider.

## 2019-02-08 NOTE — Telephone Encounter (Signed)
To go to Labor and delivery

## 2019-02-08 NOTE — Discharge Summary (Signed)
Physician Final Progress Note  Patient ID: Jenna Marks MRN: [id number] DOB/AGE: 21-21-1997 21 y.o.  Admit date: 02/08/2019 Admitting provider: Natale Milch, MD Discharge date: 02/08/2019   Admission Diagnoses: pelvic pain  Discharge Diagnoses:  Active Problems:   Indication for care in labor and delivery, antepartum IUP at 37 weeks Reactive NST Not in labor  History of Present Illness: The patient is a 23 y.o. female G2P1001 at [redacted]w[redacted]d who presents for sharp pelvic pain and feeling of pressure that began this morning. She is not currently experiencing the pain. She reports good fetal movement. She denies contractions, leaking of fluid or vaginal bleeding. She denies burning with urination. She has no other concerns at this time.   She is discharged to home with precautions.  Past Medical History:  Diagnosis Date  . Medical history non-contributory     History reviewed. No pertinent surgical history.  No current facility-administered medications on file prior to encounter.    Current Outpatient Medications on File Prior to Encounter  Medication Sig Dispense Refill  . Prenatal Vit-Fe Fumarate-FA (MULTIVITAMIN-PRENATAL) 27-0.8 MG TABS tablet Take 1 tablet by mouth daily at 12 noon.    . cephALEXin (KEFLEX) 500 MG capsule Take 1 capsule (500 mg total) by mouth 3 (three) times daily. (Patient not taking: Reported on 01/31/2019) 21 capsule 0    No Known Allergies  Social History   Socioeconomic History  . Marital status: Significant Other    Spouse name: Not on file  . Number of children: 1  . Years of education: Not on file  . Highest education level: Not on file  Occupational History  . Not on file  Social Needs  . Financial resource strain: Not hard at all  . Food insecurity    Worry: Never true    Inability: Never true  . Transportation needs    Medical: No    Non-medical: No  Tobacco Use  . Smoking status: Never Smoker  . Smokeless tobacco: Never  Used  Substance and Sexual Activity  . Alcohol use: No  . Drug use: No  . Sexual activity: Yes  Lifestyle  . Physical activity    Days per week: 3 days    Minutes per session: 10 min  . Stress: Not at all  Relationships  . Social Musician on phone: Three times a week    Gets together: Once a week    Attends religious service: 1 to 4 times per year    Active member of club or organization: No    Attends meetings of clubs or organizations: Never    Relationship status: Living with partner  . Intimate partner violence    Fear of current or ex partner: No    Emotionally abused: No    Physically abused: No    Forced sexual activity: No  Other Topics Concern  . Not on file  Social History Narrative  . Not on file    Family History  Problem Relation Age of Onset  . Cancer Neg Hx   . Diabetes Neg Hx   . Heart failure Neg Hx   . Stroke Neg Hx   . Thyroid disease Neg Hx      Review of Systems  Constitutional: Negative.   HENT: Negative.   Eyes: Negative.   Respiratory: Negative.   Cardiovascular: Negative.   Gastrointestinal: Negative.   Genitourinary:       Pelvic pain and pressure  Musculoskeletal: Negative.   Skin: Negative.  Neurological: Negative.   Endo/Heme/Allergies: Negative.   Psychiatric/Behavioral: Negative.      Physical Exam: BP 119/77 (BP Location: Left Arm)   Pulse 99   Temp 98.9 F (37.2 C) (Oral)   Ht 5\' 4"  (1.626 m)   Wt 84.4 kg   LMP 05/24/2018   BMI 31.93 kg/m   Constitutional: Well nourished, well developed female in no acute distress.  HEENT: normal Skin: Warm and dry.  Cardiovascular: Regular rate and rhythm.   Extremity: no edema  Respiratory: Clear to auscultation bilateral. Normal respiratory effort Abdomen: FHT present Back: no CVAT Neuro: DTRs 2+, Cranial nerves grossly intact Psych: Alert and Oriented x3. No memory deficits. Normal mood and affect.  MS: normal gait, normal bilateral lower extremity  ROM/strength/stability.  Pelvic exam: per RN  1/50/-3 Toco: 1 contraction while on monitor  Fetal well being: 150 bpm baseline, moderate variability, +accelerations, -decelerations  Consults: None  Significant Findings/ Diagnostic Studies: labs:  UA pending  Procedures: NST  Hospital Course: The patient was admitted to Labor and Delivery Triage for observation.   Discharge Condition: good  Disposition: Discharge disposition: 01-Home or Self Care  Diet: Regular diet  Discharge Activity: Activity as tolerated  Discharge Instructions    Discharge activity:  No Restrictions   Complete by: As directed    Discharge diet:  No restrictions   Complete by: As directed    Fetal Kick Count:  Lie on our left side for one hour after a meal, and count the number of times your baby kicks.  If it is less than 5 times, get up, move around and drink some juice.  Repeat the test 30 minutes later.  If it is still less than 5 kicks in an hour, notify your doctor.   Complete by: As directed    LABOR:  When conractions begin, you should start to time them from the beginning of one contraction to the beginning  of the next.  When contractions are 5 - 10 minutes apart or less and have been regular for at least an hour, you should call your health care provider.   Complete by: As directed    No sexual activity restrictions   Complete by: As directed    Notify physician for bleeding from the vagina   Complete by: As directed    Notify physician for blurring of vision or spots before the eyes   Complete by: As directed    Notify physician for chills or fever   Complete by: As directed    Notify physician for fainting spells, "black outs" or loss of consciousness   Complete by: As directed    Notify physician for increase in vaginal discharge   Complete by: As directed    Notify physician for leaking of fluid   Complete by: As directed    Notify physician for pain or burning when urinating    Complete by: As directed    Notify physician for pelvic pressure (sudden increase)   Complete by: As directed    Notify physician for severe or continued nausea or vomiting   Complete by: As directed    Notify physician for sudden gushing of fluid from the vagina (with or without continued leaking)   Complete by: As directed    Notify physician for sudden, constant, or occasional abdominal pain   Complete by: As directed    Notify physician if baby moving less than usual   Complete by: As directed      Allergies  as of 02/08/2019   No Known Allergies     Medication List    STOP taking these medications   cephALEXin 500 MG capsule Commonly known as: KEFLEX     TAKE these medications   multivitamin-prenatal 27-0.8 MG Tabs tablet Take 1 tablet by mouth daily at 12 noon.      [place]. Go to.   Specialty: Obstetrics and Gynecology Why: scheduled prenatal appointment Contact information: [address] [place] [address] [phone]          Total time spent taking care of this patient: 15 minutes  Signed: Rod Can, CNM  02/08/2019, 3:23 PM

## 2019-02-15 NOTE — Progress Notes (Signed)
This was forwarded to me from Terisa Starr are seeing this patient tomorrow- could you follow up with her regarding this UA?

## 2019-02-16 ENCOUNTER — Other Ambulatory Visit: Payer: Self-pay

## 2019-02-16 ENCOUNTER — Ambulatory Visit (INDEPENDENT_AMBULATORY_CARE_PROVIDER_SITE_OTHER): Payer: Medicaid Other | Admitting: Obstetrics and Gynecology

## 2019-02-16 ENCOUNTER — Encounter: Payer: Self-pay | Admitting: Obstetrics and Gynecology

## 2019-02-16 VITALS — BP 124/81 | Wt 186.0 lb

## 2019-02-16 DIAGNOSIS — Z3A38 38 weeks gestation of pregnancy: Secondary | ICD-10-CM

## 2019-02-16 DIAGNOSIS — Z3483 Encounter for supervision of other normal pregnancy, third trimester: Secondary | ICD-10-CM

## 2019-02-16 DIAGNOSIS — Z3480 Encounter for supervision of other normal pregnancy, unspecified trimester: Secondary | ICD-10-CM

## 2019-02-16 NOTE — Progress Notes (Signed)
  Routine Prenatal Care Visit  Subjective  Jenna Marks is a 23 y.o. G2P1001 at [redacted]w[redacted]d being seen today for ongoing prenatal care.  She is currently monitored for the following issues for this low-risk pregnancy and has Supervision of other normal pregnancy, antepartum and Pain of left hip joint on their problem list.  ----------------------------------------------------------------------------------- Patient reports no complaints.   Contractions: Not present. Vag. Bleeding: None.  Movement: Present. Leaking Fluid denies.  ----------------------------------------------------------------------------------- The following portions of the patient's history were reviewed and updated as appropriate: allergies, current medications, past family history, past medical history, past social history, past surgical history and problem list. Problem list updated.  Objective  Blood pressure 124/81, weight 186 lb (84.4 kg), last menstrual period 05/24/2018, not currently breastfeeding. Pregravid weight 156 lb (70.8 kg) Total Weight Gain 30 lb (13.6 kg) Urinalysis: Urine Protein    Urine Glucose    Fetal Status: Fetal Heart Rate (bpm): 145 Fundal Height: 38 cm Movement: Present  Presentation: Vertex  General:  Alert, oriented and cooperative. Patient is in no acute distress.  Skin: Skin is warm and dry. No rash noted.   Cardiovascular: Normal heart rate noted  Respiratory: Normal respiratory effort, no problems with respiration noted  Abdomen: Soft, gravid, appropriate for gestational age. Pain/Pressure: Absent     Pelvic:  Cervical exam performed Dilation: 1 Effacement (%): 50 Station: -2  Extremities: Normal range of motion.  Edema: None  Mental Status: Normal mood and affect. Normal behavior. Normal judgment and thought content.   Assessment   23 y.o. G2P1001 at [redacted]w[redacted]d by  02/28/2019, by Last Menstrual Period presenting for routine prenatal visit  Plan   pregnancy2 Problems (from 05/24/18 to  present)    Problem Noted Resolved   Supervision of other normal pregnancy, antepartum 07/05/2018 by Rod Can, CNM No   Overview Addendum 02/06/2019  1:34 PM by Dalia Heading, [facility] Prenatal Labs  Dating [place] Blood type: O/Positive/-- (06/10 0939)   Genetic Screen NIPS: neg XX Antibody:Negative (06/10 0939)  Anatomic [place]  Rubella: 1.73 (06/10 0939) Varicella: NI  GTT    Third trimester: 109 RPR: Non Reactive (06/10 0939)   Rhogam n/a HBsAg: Negative (06/10 0939)   TDaP vaccine  10/28 Flu Shot: 12/14/18 HIV: Non Reactive (06/10 0939)   Baby Food      Breast                          GBS: negative  Contraception  Pap:  CBB     CS/VBAC NA   Support Person [place]              Term labor symptoms and general obstetric precautions including but not limited to vaginal bleeding, contractions, leaking of fluid and fetal movement were reviewed in detail with the patient. Please refer to After Visit Summary for other counseling recommendations.   Return in about 1 week (around 02/23/2019) for Routine Prenatal Appointment.  Prentice Docker, MD, [facility], [facility] 02/16/2019 5:27 PM

## 2019-02-23 ENCOUNTER — Other Ambulatory Visit: Payer: Self-pay

## 2019-02-23 ENCOUNTER — Encounter: Payer: Self-pay | Admitting: Advanced Practice Midwife

## 2019-02-23 ENCOUNTER — Ambulatory Visit (INDEPENDENT_AMBULATORY_CARE_PROVIDER_SITE_OTHER): Payer: Medicaid Other | Admitting: Advanced Practice Midwife

## 2019-02-23 VITALS — BP 131/89 | Wt 190.0 lb

## 2019-02-23 DIAGNOSIS — Z3483 Encounter for supervision of other normal pregnancy, third trimester: Secondary | ICD-10-CM

## 2019-02-23 DIAGNOSIS — Z3A39 39 weeks gestation of pregnancy: Secondary | ICD-10-CM

## 2019-02-23 NOTE — Progress Notes (Signed)
  Routine Prenatal Care Visit  Subjective  Jenna Marks is a 23 y.o. G2P1001 at [redacted]w[redacted]d being seen today for ongoing prenatal care.  She is currently monitored for the following issues for this low-risk pregnancy and has Supervision of other normal pregnancy, antepartum and Pain of left hip joint on their problem list.  ----------------------------------------------------------------------------------- Patient reports doing well- no sign of labor. She has pelvic pain and pressure.   Contractions: Not present. Vag. Bleeding: None.  Movement: Present. Leaking Fluid denies.  ----------------------------------------------------------------------------------- The following portions of the patient's history were reviewed and updated as appropriate: allergies, current medications, past family history, past medical history, past social history, past surgical history and problem list. Problem list updated.  Objective  Blood pressure 131/89, weight 190 lb (86.2 kg), last menstrual period 05/24/2018 Pregravid weight 156 lb (70.8 kg) Total Weight Gain 34 lb (15.4 kg) Urinalysis: Urine Protein    Urine Glucose    Fetal Status: Fetal Heart Rate (bpm): 156   Movement: Present  Presentation: Vertex  General:  Alert, oriented and cooperative. Patient is in no acute distress.  Skin: Skin is warm and dry. No rash noted.   Cardiovascular: Normal heart rate noted  Respiratory: Normal respiratory effort, no problems with respiration noted  Abdomen: Soft, gravid, appropriate for gestational age. Pain/Pressure: Present     Pelvic:  Cervical exam performed Dilation: 3 Effacement (%): 70 Station: -2  Extremities: Normal range of motion.  Edema: None  Mental Status: Normal mood and affect. Normal behavior. Normal judgment and thought content.   Assessment   23 y.o. G2P1001 at [redacted]w[redacted]d by  02/28/2019, by Last Menstrual Period presenting for routine prenatal visit  Plan   pregnancy2 Problems (from 05/24/18 to  present)    Problem Noted Resolved   Supervision of other normal pregnancy, antepartum 07/05/2018 by Rod Can, CNM No   Overview Addendum 02/06/2019  1:34 PM by Dalia Heading, [place] Prenatal Labs  Dating [place] Blood type: O/Positive/-- (06/10 0939)   Genetic Screen NIPS: neg XX Antibody:Negative (06/10 0939)  Anatomic [place]  Rubella: 1.73 (06/10 0939) Varicella: NI  GTT    Third trimester: 109 RPR: Non Reactive (06/10 0939)   Rhogam n/a HBsAg: Negative (06/10 0939)   TDaP vaccine  10/28 Flu Shot: 12/14/18 HIV: Non Reactive (06/10 0939)   Baby Food      Breast                          GBS: negative  Contraception  Pap:  CBB     CS/VBAC NA   Support Person [place]              Term labor symptoms and general obstetric precautions including but not limited to vaginal bleeding, contractions, leaking of fluid and fetal movement were reviewed in detail with the patient.   Return in about 1 week (around 03/02/2019) for rob.  Rod Can, CNM 02/23/2019 10:46 AM

## 2019-02-23 NOTE — Progress Notes (Signed)
ROB

## 2019-02-24 ENCOUNTER — Encounter: Payer: Self-pay | Admitting: Obstetrics and Gynecology

## 2019-02-24 ENCOUNTER — Other Ambulatory Visit: Payer: Self-pay

## 2019-02-24 ENCOUNTER — Observation Stay
Admission: EM | Admit: 2019-02-24 | Discharge: 2019-02-24 | Disposition: A | Discharge status: Home / Self Care | Payer: Medicaid Other | Attending: Obstetrics and Gynecology | Admitting: Obstetrics and Gynecology

## 2019-02-24 DIAGNOSIS — Z3A39 39 weeks gestation of pregnancy: Secondary | ICD-10-CM

## 2019-02-24 DIAGNOSIS — O26893 Other specified pregnancy related conditions, third trimester: Secondary | ICD-10-CM | POA: Diagnosis not present

## 2019-02-24 DIAGNOSIS — Z3480 Encounter for supervision of other normal pregnancy, unspecified trimester: Secondary | ICD-10-CM

## 2019-02-24 LAB — RUPTURE OF MEMBRANE (ROM)PLUS: Rom Plus: NEGATIVE

## 2019-02-24 NOTE — Discharge Summary (Signed)
  Physician Discharge Summary   Patient ID: Jenna Marks [id number] 23 y.o. 10/29/1995  Admit date: 02/24/2019  Discharge date and time: No discharge date for patient encounter.   Admitting Physician: Homero Fellers, MD   Discharge Physician: Adrian Prows MD  Admission Diagnoses: [redacted] weeks gestation of pregnancy [Z3A.39]  Discharge Diagnoses: Same as above  Admission Condition: good  Discharged Condition: good  Indication for Admission: Evaluation after loss of mucus plug  Hospital Course: Patient was admitted for evaluation after loss of mucus plug and possible leakage of fluid at home. She had a quick labor with her last pregnancy and did not perceive a lot of contractions. Cervical exam was unchanged from yesterday in office. Rom plus was negative. She is interested in IOL. Nurse coordinator will follow up with patient regarding IOL possibly Sunday.   Consults: None  Significant Diagnostic Studies: labs: See EPIC  Treatments: none  Discharge Exam: BP (!) 146/82   Pulse 91   Temp 98.3 F (36.8 C) (Oral)   Resp 16   Ht 5\' 4"  (1.626 m)   Wt 86.2 kg   LMP 05/24/2018   BMI 32.61 kg/m   General Appearance:    Alert, cooperative, no distress, appears stated age  Head:    Normocephalic, without obvious abnormality, atraumatic  Eyes:    PERRL, conjunctiva/corneas clear, EOM's intact, fundi    benign, both eyes  Ears:    Normal TM's and external ear canals, both ears  Nose:   Nares normal, septum midline, mucosa normal, no drainage    or sinus tenderness  Throat:   Lips, mucosa, and tongue normal; teeth and gums normal  Neck:   Supple, symmetrical, trachea midline, no adenopathy;    thyroid:  no enlargement/tenderness/nodules; no carotid   bruit or JVD  Back:     Symmetric, no curvature, ROM normal, no CVA tenderness  Lungs:     Clear to auscultation bilaterally, respirations unlabored  Chest Wall:    No tenderness or deformity   Heart:    Regular  rate and rhythm, S1 and S2 normal, no murmur, rub   or gallop  Breast Exam:    No tenderness, masses, or nipple abnormality  Abdomen:     Soft, non-tender, bowel sounds active all four quadrants,    no masses, no organomegaly  Genitalia:    Normal female without lesion, discharge or tenderness  Rectal:    Normal tone, normal prostate, no masses or tenderness;   guaiac negative stool  Extremities:   Extremities normal, atraumatic, no cyanosis or edema  Pulses:   2+ and symmetric all extremities  Skin:   Skin color, texture, turgor normal, no rashes or lesions  Lymph nodes:   Cervical, supraclavicular, and axillary nodes normal  Neurologic:   CNII-XII intact, normal strength, sensation and reflexes    throughout    Disposition: Discharge disposition: 01-Home or Self Care       Patient Instructions:  Allergies as of 02/24/2019   No Known Allergies     Medication List    TAKE these medications   multivitamin-prenatal 27-0.8 MG Tabs tablet Take 1 tablet by mouth daily at 12 noon.      Activity: activity as tolerated Diet: regular diet Wound Care: none needed  Follow-up with [facility] in 1 week.  Signed: Homero Fellers 02/24/2019 9:31 PM

## 2019-02-24 NOTE — OB Triage Note (Signed)
Pt is a G2P1001 and [redacted]w[redacted]d presenting to L&D with c/o leaking of fluid at 1845 and that she lost her mucus plug. Pt states she is unable to determine the color of fluid because she was wearing black. Pt states her pain is 1/10 out of a 0-10 pain scale. Pt confirms positive fetal movement. Pts blood pressure upon admission is 147/77. Will continue to monitor.

## 2019-03-01 ENCOUNTER — Inpatient Hospital Stay
Admission: RE | Admit: 2019-03-01 | Discharge: 2019-03-03 | DRG: 807 | Disposition: A | Discharge status: Home / Self Care | Payer: Medicaid Other | Attending: Certified Nurse Midwife | Admitting: Certified Nurse Midwife

## 2019-03-01 ENCOUNTER — Other Ambulatory Visit: Payer: Self-pay

## 2019-03-01 ENCOUNTER — Encounter: Payer: Self-pay | Admitting: Obstetrics and Gynecology

## 2019-03-01 DIAGNOSIS — O480 Post-term pregnancy: Secondary | ICD-10-CM | POA: Diagnosis not present

## 2019-03-01 DIAGNOSIS — O139 Gestational [pregnancy-induced] hypertension without significant proteinuria, unspecified trimester: Secondary | ICD-10-CM | POA: Diagnosis present

## 2019-03-01 DIAGNOSIS — O134 Gestational [pregnancy-induced] hypertension without significant proteinuria, complicating childbirth: Principal | ICD-10-CM | POA: Diagnosis present

## 2019-03-01 DIAGNOSIS — Z20822 Contact with and (suspected) exposure to covid-19: Secondary | ICD-10-CM | POA: Diagnosis present

## 2019-03-01 DIAGNOSIS — O163 Unspecified maternal hypertension, third trimester: Secondary | ICD-10-CM | POA: Diagnosis present

## 2019-03-01 DIAGNOSIS — O26893 Other specified pregnancy related conditions, third trimester: Secondary | ICD-10-CM | POA: Diagnosis present

## 2019-03-01 DIAGNOSIS — Z3A40 40 weeks gestation of pregnancy: Secondary | ICD-10-CM | POA: Diagnosis not present

## 2019-03-01 DIAGNOSIS — Z3490 Encounter for supervision of normal pregnancy, unspecified, unspecified trimester: Secondary | ICD-10-CM | POA: Diagnosis present

## 2019-03-01 LAB — CBC
HCT: 34.7 % — ABNORMAL LOW (ref 36.0–46.0)
Hemoglobin: 11.8 g/dL — ABNORMAL LOW (ref 12.0–15.0)
MCH: 28.7 pg (ref 26.0–34.0)
MCHC: 34 g/dL (ref 30.0–36.0)
MCV: 84.4 fL (ref 80.0–100.0)
Platelets: 216 10*3/uL (ref 150–400)
RBC: 4.11 MIL/uL (ref 3.87–5.11)
RDW: 15 % (ref 11.5–15.5)
WBC: 11.7 10*3/uL — ABNORMAL HIGH (ref 4.0–10.5)
nRBC: 0 % (ref 0.0–0.2)

## 2019-03-01 LAB — PROTEIN / CREATININE RATIO, URINE
Creatinine, Urine: 65 mg/dL
Protein Creatinine Ratio: 0.23 mg/mg{Cre} — ABNORMAL HIGH (ref 0.00–0.15)
Total Protein, Urine: 15 mg/dL

## 2019-03-01 LAB — TYPE AND SCREEN
ABO/RH(D): O POS
Antibody Screen: NEGATIVE

## 2019-03-01 LAB — COMPREHENSIVE METABOLIC PANEL
ALT: 12 U/L (ref 0–44)
AST: 16 U/L (ref 15–41)
Albumin: 2.9 g/dL — ABNORMAL LOW (ref 3.5–5.0)
Alkaline Phosphatase: 203 U/L — ABNORMAL HIGH (ref 38–126)
Anion gap: 10 (ref 5–15)
BUN: 10 mg/dL (ref 6–20)
CO2: 19 mmol/L — ABNORMAL LOW (ref 22–32)
Calcium: 8.7 mg/dL — ABNORMAL LOW (ref 8.9–10.3)
Chloride: 108 mmol/L (ref 98–111)
Creatinine, Ser: 0.48 mg/dL (ref 0.44–1.00)
GFR calc Af Amer: >60 mL/min (ref >60)
GFR calc non Af Amer: >60 mL/min (ref >60)
Glucose, Bld: 123 mg/dL — ABNORMAL HIGH (ref 70–99)
Potassium: 3.6 mmol/L (ref 3.5–5.1)
Sodium: 137 mmol/L (ref 135–145)
Total Bilirubin: 0.3 mg/dL (ref 0.3–1.2)
Total Protein: 6.5 g/dL (ref 6.5–8.1)

## 2019-03-01 LAB — CHLAMYDIA/NGC RT PCR (ARMC ONLY)
Chlamydia Tr: NOT DETECTED
N gonorrhoeae: NOT DETECTED

## 2019-03-01 LAB — RESPIRATORY PANEL BY RT PCR (FLU A&B, COVID)
Influenza A by PCR: NEGATIVE
Influenza B by PCR: NEGATIVE
SARS Coronavirus 2 by RT PCR: NEGATIVE

## 2019-03-01 MED ORDER — TERBUTALINE SULFATE 1 MG/ML IJ SOLN: 0.2500 mg | Freq: Once | INTRAMUSCULAR | Status: DC | PRN

## 2019-03-01 MED ORDER — OXYTOCIN 40 UNITS IN NORMAL SALINE INFUSION - SIMPLE MED
2.5000 [IU]/h | INTRAVENOUS | Status: DC
  Filled 2019-03-01: qty 1000

## 2019-03-01 MED ORDER — LIDOCAINE HCL (PF) 1 % IJ SOLN
30.0000 mL | INTRAMUSCULAR | Status: AC | PRN
  Administered 2019-03-02: 1.2 mL via SUBCUTANEOUS

## 2019-03-01 MED ORDER — LACTATED RINGERS IV SOLN: INTRAVENOUS | Status: DC

## 2019-03-01 MED ORDER — LACTATED RINGERS IV SOLN: 500.0000 mL | INTRAVENOUS | Status: DC | PRN

## 2019-03-01 MED ORDER — ONDANSETRON HCL 4 MG/2ML IJ SOLN: 4.0000 mg | Freq: Four times a day (QID) | INTRAMUSCULAR | Status: DC | PRN

## 2019-03-01 MED ORDER — OXYTOCIN 40 UNITS IN NORMAL SALINE INFUSION - SIMPLE MED
1.0000 m[IU]/min | INTRAVENOUS | Status: DC
  Administered 2019-03-01: 2 m[IU]/min via INTRAVENOUS

## 2019-03-01 MED ORDER — OXYTOCIN BOLUS FROM INFUSION
500.0000 mL | Freq: Once | INTRAVENOUS | Status: AC
  Administered 2019-03-02: 500 mL via INTRAVENOUS

## 2019-03-01 MED ORDER — BUTORPHANOL TARTRATE 1 MG/ML IJ SOLN
1.0000 mg | INTRAMUSCULAR | Status: DC | PRN
  Administered 2019-03-02: 1 mg via INTRAVENOUS
  Filled 2019-03-01 (×2): qty 1

## 2019-03-01 NOTE — OB Triage Note (Signed)
Patient arrived for scheduled elective induction of labor.  Several calls were attempted this am to patient to postpone due to no labor rooms available, no answer.  Patient placed on monitor and will discuss with MD to assess plan.

## 2019-03-01 NOTE — Progress Notes (Addendum)
L&D Progress Note   S: Contractions getting a little stronger. Has had some bloody show  O: Vital signs: BP 126/88 (BP Location: Left Arm)   Pulse 89   Temp 97.6 F (36.4 C) (Oral)   Resp 16   Ht 5\' 5"  (1.651 m)   Wt 86.2 kg   LMP 05/24/2018   BMI 31.62 kg/m   Blood pressures have been in the normal to mild range  General: appears uncomfortable with some contractions  FHR: 145 to 150 baseline with accelerations to 160s to 170, moderate variability Toco: contractions every 3-5 minutes  Cervix: 4/70%/-1 to 0  A: IUP at 40wk1d for elective IOL Possible gestational hypertension Latent or early labor  P: Start Pitocin induction/augmentation Plan to AROM when contracting well. Stadol for pain Epidural if desires Dalia Heading, CNM

## 2019-03-01 NOTE — H&P (Signed)
OB History & Physical   History of Present Illness:  Chief Complaint:   HPI:  Jenna Marks is a 23 y.o. G18P1001 female with EDC=02/28/2019 at [redacted]w[redacted]d dated by LMP.  Her pregnancy has been uncomplicated.  She presented to L&D originally for an elective IOL and her induction was going to be postponed due to lack of open labor beds. However she had mildly elevated blood pressures of 149/85 and 145/83 and was admitted for a preeclampsia work up.  She denies headache, nausea/vomiting, visual changes or RUQ pain.  Has been having mild contractions, but denies leakage of fluid or vaginal bleeding.  Prenatal care site: Prenatal care at [place] been remarkable for  Clinic [facility] Prenatal Labs  Dating [place] Blood type: O/Positive/-- (06/10 0939)   Genetic Screen NIPS: neg XX Antibody:Negative (06/10 0939)  Anatomic [place]  Rubella: 1.73 (06/10 0939) Varicella: NI  GTT    Third trimester: 109 RPR: Non Reactive (06/10 0939)   Rhogam n/a HBsAg: Negative (06/10 0939)   TDaP vaccine  10/28 Flu Shot: 12/14/18 HIV: Non Reactive (06/10 0939)   Baby Food      Breast                          GBS: negative  Contraception BTL, interval (needs to sign 30 d papers) and Depo bridge Pap:  CBB     CS/VBAC NA   Support Person Clinton         Maternal Medical History:   Past Medical History:  Diagnosis Date  . Medical history non-contributory     History reviewed. No pertinent surgical history.  No Known Allergies  Prior to Admission medications   Medication Sig Start Date End Date Taking? Authorizing Provider  Prenatal Vit-Fe Fumarate-FA (MULTIVITAMIN-PRENATAL) 27-0.8 MG TABS tablet Take 1 tablet by mouth daily at 12 noon.   Yes [provider]          Social History: She  reports that she has never smoked. She has never used smokeless tobacco. She reports that she does not drink alcohol or use drugs.  Family History: family history is not on file.   Review of  Systems: Negative x 10 systems reviewed except as noted in the HPI.      Physical Exam:  Vital Signs: BP 121/74   Pulse 88   Temp 98 F (36.7 C) (Oral)   Resp 20   Ht 5\' 5"  (1.651 m)   Wt 86.2 kg   LMP 05/24/2018   BMI 31.62 kg/m   Patient Vitals for the past 24 hrs:  BP Temp Temp src Pulse Resp Height Weight  03/01/19 1112 121/74 -- -- 88 -- -- --  03/01/19 1041 139/85 -- -- 86 -- -- --  03/01/19 1027 -- -- -- -- -- 5\' 5"  (1.651 m) 86.2 kg  03/01/19 0927 (!) 145/83 98 F (36.7 C) Oral 95 20 -- --  03/01/19 0807 (!) 149/85 -- -- 98 -- -- --   General: gravid WF in no acute distress.  HEENT: normocephalic, atraumatic Heart: regular rate & rhythm.  No murmurs/rubs/gallops Lungs: clear to auscultation bilaterally Abdomen: soft, gravid, non-tender;  EFW: 7#89/ cephalic Pelvic:   External: Normal external female genitalia  Cervix: Dilation: 3.5 / Effacement (%): 70 / Station: -1   Extremities: non-tender, symmetric, trace edema bilaterally.  DTRs: +2 to +3/+3  Neurologic: Alert & oriented x 3.   Baseline FHR: baseline 145-150 with accelerations to 160s,  moderate variability Toco; Contractions every 7-8 min apart  Results for orders placed or performed during the hospital encounter of 03/01/19 (from the past 24 hour(s))  Respiratory Panel by RT PCR (Flu A&B, Covid) - Nasopharyngeal Swab     Status: None   Collection Time: 03/01/19  9:06 AM   Specimen: Nasopharyngeal Swab  Result Value Ref Range   SARS Coronavirus 2 by RT PCR NEGATIVE NEGATIVE   Influenza A by PCR NEGATIVE NEGATIVE   Influenza B by PCR NEGATIVE NEGATIVE  Protein / creatinine ratio, urine     Status: Abnormal   Collection Time: 03/01/19 10:08 AM  Result Value Ref Range   Creatinine, Urine 65 mg/dL   Total Protein, Urine 15 mg/dL   Protein Creatinine Ratio 0.23 (H) 0.00 - 0.15 mg/mg[Cre]  Type and screen [facility]     Status: None   Collection Time: 03/01/19 10:27 AM  Result Value  Ref Range   ABO/RH(D) O POS    Antibody Screen NEG    Sample Expiration      03/04/2019,2359 Performed at [facility], [address] [place], [place] [address]   CBC     Status: Abnormal   Collection Time: 03/01/19 10:27 AM  Result Value Ref Range   WBC 11.7 (H) 4.0 - 10.5 K/uL   RBC 4.11 3.87 - 5.11 MIL/uL   Hemoglobin 11.8 (L) 12.0 - 15.0 g/dL   HCT 05.6 (L) 97.9 - 48.0 %   MCV 84.4 80.0 - 100.0 fL   MCH 28.7 26.0 - 34.0 pg   MCHC 34.0 30.0 - 36.0 g/dL   RDW 16.5 53.7 - 48.2 %   Platelets 216 150 - 400 K/uL   nRBC 0.0 0.0 - 0.2 %  Comprehensive metabolic panel     Status: Abnormal   Collection Time: 03/01/19 10:27 AM  Result Value Ref Range   Sodium 137 135 - 145 mmol/L   Potassium 3.6 3.5 - 5.1 mmol/L   Chloride 108 98 - 111 mmol/L   CO2 19 (L) 22 - 32 mmol/L   Glucose, Bld 123 (H) 70 - 99 mg/dL   BUN 10 6 - 20 mg/dL   Creatinine, Ser 7.07 0.44 - 1.00 mg/dL   Calcium 8.7 (L) 8.9 - 10.3 mg/dL   Total Protein 6.5 6.5 - 8.1 g/dL   Albumin 2.9 (L) 3.5 - 5.0 g/dL   AST 16 15 - 41 U/L   ALT 12 0 - 44 U/L   Alkaline Phosphatase 203 (H) 38 - 126 U/L   Total Bilirubin 0.3 0.3 - 1.2 mg/dL   GFR calc non Af Amer >60 >60 mL/min   GFR calc Af Amer >60 >60 mL/min   Anion gap 10 5 - 15   Assessment:  Jenna Marks is a 23 y.o. G85P1001 female at [redacted]w[redacted]d with elevated blood pressures initially  Possible gestational hypertension  FWB: Cat 1 tracing  Plan:  1. Admit to Labor & Delivery -awaiting bed for IOL  Plan Pitocin and AROM. Will continue to monitor blood pressures and order regular diet 2. GBS negative 3. Pain relief measures discussed  4. Consents obtained. 5. Breast 6. Depo/BTL? 7. O POS/ RI/ VNI 8. TDAP UTD  Farrel Conners  03/01/2019 11:32 AM

## 2019-03-02 ENCOUNTER — Encounter: Payer: Self-pay | Admitting: Obstetrics and Gynecology

## 2019-03-02 ENCOUNTER — Encounter: Payer: Medicaid Other | Admitting: Advanced Practice Midwife

## 2019-03-02 ENCOUNTER — Inpatient Hospital Stay: Payer: Medicaid Other | Admitting: Anesthesiology

## 2019-03-02 DIAGNOSIS — Z3490 Encounter for supervision of normal pregnancy, unspecified, unspecified trimester: Secondary | ICD-10-CM | POA: Diagnosis present

## 2019-03-02 DIAGNOSIS — O139 Gestational [pregnancy-induced] hypertension without significant proteinuria, unspecified trimester: Secondary | ICD-10-CM | POA: Diagnosis present

## 2019-03-02 DIAGNOSIS — O134 Gestational [pregnancy-induced] hypertension without significant proteinuria, complicating childbirth: Secondary | ICD-10-CM

## 2019-03-02 LAB — RPR: RPR Ser Ql: NONREACTIVE

## 2019-03-02 MED ORDER — DIBUCAINE (PERIANAL) 1 % EX OINT: 1.0000 "application " | TOPICAL_OINTMENT | CUTANEOUS | Status: DC | PRN

## 2019-03-02 MED ORDER — WITCH HAZEL-GLYCERIN EX PADS: 1.0000 "application " | MEDICATED_PAD | CUTANEOUS | Status: DC | PRN

## 2019-03-02 MED ORDER — SIMETHICONE 80 MG PO CHEW: 80.0000 mg | CHEWABLE_TABLET | ORAL | Status: DC | PRN

## 2019-03-02 MED ORDER — AMMONIA AROMATIC IN INHA
RESPIRATORY_TRACT | Status: AC
  Filled 2019-03-02: qty 10

## 2019-03-02 MED ORDER — PHENYLEPHRINE 40 MCG/ML (10ML) SYRINGE FOR IV PUSH (FOR BLOOD PRESSURE SUPPORT): 80.0000 ug | PREFILLED_SYRINGE | INTRAVENOUS | Status: DC | PRN

## 2019-03-02 MED ORDER — FERROUS SULFATE 325 (65 FE) MG PO TABS
325.0000 mg | ORAL_TABLET | Freq: Every day | ORAL | Status: DC
  Administered 2019-03-03: 325 mg via ORAL
  Filled 2019-03-02: qty 1

## 2019-03-02 MED ORDER — COCONUT OIL OIL: 1.0000 "application " | TOPICAL_OIL | Status: DC | PRN

## 2019-03-02 MED ORDER — LIDOCAINE HCL (PF) 1 % IJ SOLN
INTRAMUSCULAR | Status: AC
  Filled 2019-03-02: qty 30

## 2019-03-02 MED ORDER — FENTANYL 2.5 MCG/ML W/ROPIVACAINE 0.15% IN NS 100 ML EPIDURAL (ARMC)
EPIDURAL | Status: AC
  Filled 2019-03-02: qty 100

## 2019-03-02 MED ORDER — ONDANSETRON HCL 4 MG PO TABS: 4.0000 mg | ORAL_TABLET | ORAL | Status: DC | PRN

## 2019-03-02 MED ORDER — BENZOCAINE-MENTHOL 20-0.5 % EX AERO: 1.0000 "application " | INHALATION_SPRAY | CUTANEOUS | Status: DC | PRN

## 2019-03-02 MED ORDER — PRENATAL MULTIVITAMIN CH
1.0000 | ORAL_TABLET | Freq: Every day | ORAL | Status: DC
  Administered 2019-03-02 – 2019-03-03 (×2): 1 via ORAL
  Filled 2019-03-02 (×2): qty 1

## 2019-03-02 MED ORDER — ACETAMINOPHEN 325 MG PO TABS: 650.0000 mg | ORAL_TABLET | ORAL | Status: DC | PRN

## 2019-03-02 MED ORDER — OXYCODONE HCL 5 MG PO TABS: 5.0000 mg | ORAL_TABLET | ORAL | Status: DC | PRN

## 2019-03-02 MED ORDER — EPHEDRINE 5 MG/ML INJ: 10.0000 mg | INTRAVENOUS | Status: DC | PRN

## 2019-03-02 MED ORDER — FENTANYL 2.5 MCG/ML W/ROPIVACAINE 0.15% IN NS 100 ML EPIDURAL (ARMC)
EPIDURAL | Status: DC | PRN
  Administered 2019-03-02: 12 mL/h via EPIDURAL

## 2019-03-02 MED ORDER — MISOPROSTOL 200 MCG PO TABS
ORAL_TABLET | ORAL | Status: AC
  Administered 2019-03-02: 800 ug
  Filled 2019-03-02: qty 4

## 2019-03-02 MED ORDER — IBUPROFEN 600 MG PO TABS
600.0000 mg | ORAL_TABLET | Freq: Four times a day (QID) | ORAL | Status: DC
  Administered 2019-03-02 – 2019-03-03 (×5): 600 mg via ORAL
  Filled 2019-03-02 (×5): qty 1

## 2019-03-02 MED ORDER — FENTANYL 2.5 MCG/ML W/ROPIVACAINE 0.15% IN NS 100 ML EPIDURAL (ARMC): 12.0000 mL/h | EPIDURAL | Status: DC

## 2019-03-02 MED ORDER — SENNOSIDES-DOCUSATE SODIUM 8.6-50 MG PO TABS
2.0000 | ORAL_TABLET | ORAL | Status: DC
  Administered 2019-03-02: 22:00:00 2 via ORAL
  Filled 2019-03-02: qty 2

## 2019-03-02 MED ORDER — LIDOCAINE-EPINEPHRINE (PF) 1.5 %-1:200000 IJ SOLN
INTRAMUSCULAR | Status: DC | PRN
  Administered 2019-03-02: 3 mL via EPIDURAL

## 2019-03-02 MED ORDER — LACTATED RINGERS IV SOLN: 500.0000 mL | Freq: Once | INTRAVENOUS | Status: DC

## 2019-03-02 MED ORDER — OXYTOCIN 10 UNIT/ML IJ SOLN
INTRAMUSCULAR | Status: AC
  Filled 2019-03-02: qty 2

## 2019-03-02 MED ORDER — ONDANSETRON HCL 4 MG/2ML IJ SOLN: 4.0000 mg | INTRAMUSCULAR | Status: DC | PRN

## 2019-03-02 MED ORDER — DIPHENHYDRAMINE HCL 50 MG/ML IJ SOLN: 12.5000 mg | INTRAMUSCULAR | Status: DC | PRN

## 2019-03-02 NOTE — Anesthesia Procedure Notes (Signed)
Epidural Patient location during procedure: OB Start time: 03/02/2019 4:41 AM End time: 03/02/2019 5:04 AM  Staffing Performed: anesthesiologist   Preanesthetic Checklist Completed: patient identified, IV checked, site marked, risks and benefits discussed, surgical consent, monitors and equipment checked, pre-op evaluation and timeout performed  Epidural Patient position: sitting Prep: Betadine Patient monitoring: heart rate, continuous pulse ox and blood pressure Approach: midline Location: L4-L5 Injection technique: LOR saline  Needle:  Needle type: Tuohy  Needle gauge: 17 G Needle length: 9 cm and 9 Needle insertion depth: 7 cm Catheter type: closed end flexible Catheter size: 20 Guage Catheter at skin depth: 13 cm Test dose: negative and 1.5% lidocaine with Epi 1:200 K  Assessment Events: blood not aspirated, injection not painful, no injection resistance, no paresthesia and negative IV test  Additional Notes   Patient tolerated the insertion well without complications.Reason for block:procedure for pain

## 2019-03-02 NOTE — Anesthesia Preprocedure Evaluation (Signed)
Anesthesia Evaluation  Patient identified by MRN, date of birth, ID band Patient awake    Reviewed: Allergy & Precautions, NPO status , Patient's Chart, lab work & pertinent test results  History of Anesthesia Complications Negative for: history of anesthetic complications  Airway Mallampati: III       Dental   Pulmonary neg sleep apnea, neg COPD,           Cardiovascular (-) hypertension(-) CHF (-) dysrhythmias (-) Valvular Problems/Murmurs     Neuro/Psych neg Seizures    GI/Hepatic Neg liver ROS, neg GERD  ,  Endo/Other  neg diabetes  Renal/GU negative Renal ROS     Musculoskeletal   Abdominal (+) + obese,   Peds  Hematology   Anesthesia Other Findings   Reproductive/Obstetrics (+) Pregnancy                             Anesthesia Physical Anesthesia Plan  ASA: II  Anesthesia Plan: Epidural   Post-op Pain Management:    Induction:   PONV Risk Score and Plan:   Airway Management Planned:   Additional Equipment:   Intra-op Plan:   Post-operative Plan:   Informed Consent: I have reviewed the patients History and Physical, chart, labs and discussed the procedure including the risks, benefits and alternatives for the proposed anesthesia with the patient or authorized representative who has indicated his/her understanding and acceptance.       Plan Discussed with:   Anesthesia Plan Comments:         Anesthesia Quick Evaluation

## 2019-03-02 NOTE — Discharge Summary (Signed)
Physician Obstetric Discharge Summary  Patient ID: Jenna Marks MRN: [id number] DOB/AGE: 1995-07-17 23 y.o.   Date of Admission: 03/01/2019 Date of Delivery: 03/02/2019 Delivering Provider: Janalyn Harder Date of Discharge: 03/03/2019  Admitting Diagnosis: Induction of labor at [redacted]w[redacted]d  Secondary Diagnosis: Gestational hypertension  Mode of Delivery: normal spontaneous vaginal delivery 03/02/2019      Discharge Diagnosis: Gestational hypertension and Term pregnancy delivered, uterine atony   Intrapartum Procedures: Atificial rupture of membranes, curettage, epidural and Pitocin induction   Post partum procedures: none  Complications: none   Brief Hospital Course  Jenna Marks is a [id number] who had a SVD on 03/02/2019 after an induction of labor for gestational hypertension;  for further details of this delivery, please refer to the delivery note.  Patient had an uncomplicated postpartum course.  By time of discharge on PPD#1, her pain was controlled on oral pain medications; she had appropriate lochia and was ambulating, voiding without difficulty and tolerating regular diet.  She was deemed stable for discharge to home.     Labs: CBC Latest Ref Rng & Units 03/03/2019 03/01/2019 11/30/2018  WBC 4.0 - 10.5 K/uL 9.6 11.7(H) 10.5  Hemoglobin 12.0 - 15.0 g/dL 10.5(L) 11.8(L) 11.0(L)  Hematocrit 36.0 - 46.0 % 30.4(L) 34.7(L) 32.3(L)  Platelets 150 - 400 K/uL 201 216 242   O POS/ RI/ VNI  Physical exam:  Blood pressure 122/86, pulse 86, temperature 97.8 F (36.6 C), temperature source Oral, resp. rate 18, height 5\' 5"  (1.651 m), weight 86.2 kg, last menstrual period 05/24/2018, SpO2 99 %, unknown if currently breastfeeding. General: alert and no distress Lochia: appropriate Abdomen: soft, NT Uterine Fundus: firm Extremities: No evidence of DVT seen on physical exam. No lower extremity edema.  Discharge Instructions: Per After Visit Summary. Activity: Advance as  tolerated. Pelvic rest for 6 weeks.  Also refer to Discharge Instructions Diet: Regular Medications: Allergies as of 03/03/2019   No Known Allergies     Medication List    TAKE these medications   medroxyPROGESTERone 150 MG/ML injection Commonly known as: DEPO-PROVERA Inject 1 mL (150 mg total) into the muscle once for 1 dose. Start taking on: March 04, 2019 - FIRST DOSE on DISCHARGE HERE   multivitamin-prenatal 27-0.8 MG Tabs tablet Take 1 tablet by mouth daily at 12 noon.      Outpatient follow up:  Follow-up Information    Dalia Heading, CNM. Schedule an appointment as soon as possible for a visit.   Specialty: Certified Nurse Midwife Why: for 6 week postpartum check Contact information: [place] [address] [phone]          Postpartum contraception: Depo-Provera  Discharged Condition: good  Discharged to: home   Newborn Data: Female infant 9#1oz Disposition:home with mother  Apgars: APGAR (1 MIN): 7   APGAR (5 MINS): 9    Baby Feeding: Breast  Hoyt Koch, MD 03/03/2019 9:42 AM

## 2019-03-03 LAB — CBC
HCT: 30.4 % — ABNORMAL LOW (ref 36.0–46.0)
Hemoglobin: 10.5 g/dL — ABNORMAL LOW (ref 12.0–15.0)
MCH: 28.7 pg (ref 26.0–34.0)
MCHC: 34.5 g/dL (ref 30.0–36.0)
MCV: 83.1 fL (ref 80.0–100.0)
Platelets: 201 10*3/uL (ref 150–400)
RBC: 3.66 MIL/uL — ABNORMAL LOW (ref 3.87–5.11)
RDW: 15.1 % (ref 11.5–15.5)
WBC: 9.6 10*3/uL (ref 4.0–10.5)
nRBC: 0 % (ref 0.0–0.2)

## 2019-03-03 MED ORDER — MEDROXYPROGESTERONE ACETATE 150 MG/ML IM SUSP: 150.0000 mg | Freq: Once | INTRAMUSCULAR | 1 refills | Status: DC

## 2019-03-03 MED ORDER — MEDROXYPROGESTERONE ACETATE 150 MG/ML IM SUSP
150.0000 mg | Freq: Once | INTRAMUSCULAR | Status: AC
  Administered 2019-03-03: 150 mg via INTRAMUSCULAR
  Filled 2019-03-03: qty 1

## 2019-03-03 MED ORDER — VARICELLA VIRUS VACCINE LIVE 1350 PFU/0.5ML IJ SUSR
0.5000 mL | Freq: Once | INTRAMUSCULAR | Status: AC
  Administered 2019-03-03: 0.5 mL via SUBCUTANEOUS
  Filled 2019-03-03 (×2): qty 0.5

## 2019-03-03 NOTE — Discharge Instructions (Signed)

## 2019-03-03 NOTE — Progress Notes (Signed)
Admit Date: 03/01/2019 Today's Date: 03/03/2019  Post Partum Day 1  Subjective:  no complaints, up ad lib, voiding and tolerating PO  Objective: Temp:  [97.5 F (36.4 C)-99.2 F (37.3 C)] 97.8 F (36.6 C) (01/01 0737) Pulse Rate:  [78-86] 86 (01/01 0737) Resp:  [18-20] 18 (01/01 0737) BP: (120-139)/(68-89) 122/86 (01/01 0737) SpO2:  [99 %-100 %] 99 % (01/01 0737)  Physical Exam:  General: alert and cooperative Lochia: appropriate Uterine Fundus: firm Incision: none DVT Evaluation: No evidence of DVT seen on physical exam.  Recent Labs    03/01/19 1027 03/03/19 0541  HGB 11.8* 10.5*  HCT 34.7* 30.4*    Assessment/Plan: Discharge home, Breastfeeding, Contraception (Depo now, consideing BTL later) and Infant doing well   LOS: 2 days   Jenna Marks [facility] 03/03/2019, 10:06 AM

## 2019-03-03 NOTE — Anesthesia Postprocedure Evaluation (Signed)
Anesthesia Post Note  Patient: Jenna Marks  Procedure(s) Performed: AN AD HOC LABOR EPIDURAL  Patient location during evaluation: Mother Baby Anesthesia Type: Epidural Level of consciousness: awake and alert and oriented Pain management: pain level controlled Vital Signs Assessment: post-procedure vital signs reviewed and stable Respiratory status: spontaneous breathing Cardiovascular status: blood pressure returned to baseline Postop Assessment: no headache and no backache Anesthetic complications: no     Last Vitals:  Vitals:   03/03/19 0350 03/03/19 0737  BP: 120/88 122/86  Pulse: 78 86  Resp: 18 18  Temp: (!) 36.4 C 36.6 C  SpO2: 100% 99%    Last Pain:  Vitals:   03/03/19 1000  TempSrc:   PainSc: 0-No pain                 Jaidon Sponsel

## 2019-03-03 NOTE — Lactation Note (Signed)
This note was copied from a baby's chart. Lactation Consultation Note  Patient Name: Jenna Marks Date: 03/03/2019 Reason for consult: Follow-up assessment Mom for d/c with baby, states breastfeeding going well, baby latches well but pulls off frequently with some feedings, offered to observe a feeding before d/c, LC name and phone no. Written on white board for mom call if needed., states baby nursing better than first child.    Maternal Data Formula Feeding for Exclusion: No Does the patient have breastfeeding experience prior to this delivery?: Yes  Feeding Feeding Type: Breast Fed  LATCH Score Latch: (did not observe a feeding)                 Interventions    Lactation Tools Discussed/Used WIC Program: No   Consult Status Consult Status: Complete    Dyann Kief 03/03/2019, 2:16 PM

## 2019-03-03 NOTE — Progress Notes (Signed)
Discharge order received from doctor. Depo given at discharge. Varicella vaccine given at discharge. Reviewed discharge instructions and prescriptions with patient and answered all questions. Follow up appointment instructions given. Patient verbalized understanding. ID bands checked. Patient discharged home with infant via wheelchair by nursing/auxillary.    Oswald Hillock, RN

## 2019-04-11 ENCOUNTER — Ambulatory Visit: Payer: Medicaid Other | Admitting: Certified Nurse Midwife

## 2019-04-11 NOTE — Progress Notes (Deleted)
Postpartum Visit  Chief Complaint: No chief complaint on file.   History of Present Illness: Jenna Marks is a 24 y.o. [id number] presents for postpartum visit.  Date of delivery: *** Type of delivery: Vaginal delivery - Vacuum or forceps assisted  {yes/no:63} Episiotomy No.  Laceration: {yes/no:63}  Pregnancy or labor problems:  {yes/no:63} Any problems since the delivery:  {yes/no:63}  Newborn Details:  SINGLETON :  1. Baby's name: ***. Birth weight: *** Maternal Details:  Breast Feeding:  {yes/no:63} Post partum depression/anxiety noted:  {yes/no:63} Edinburgh Post-Partum Depression Score:  {numbers 5-80:99833}  Date of last PAP: ***  {norm/abn:16337}   Review of Systems: ROS  Past Medical History:  Past Medical History:  Diagnosis Date  . Medical history non-contributory     Past Surgical History:  No past surgical history on file.  Family History:  Family History  Problem Relation Age of Onset  . Cancer Neg Hx   . Diabetes Neg Hx   . Heart failure Neg Hx   . Stroke Neg Hx   . Thyroid disease Neg Hx     Social History:  Social History   Socioeconomic History  . Marital status: Significant Other    Spouse name: Not on file  . Number of children: 1  . Years of education: Not on file  . Highest education level: Not on file  Occupational History  . Not on file  Tobacco Use  . Smoking status: Never Smoker  . Smokeless tobacco: Never Used  Substance and Sexual Activity  . Alcohol use: No  . Drug use: No  . Sexual activity: Yes    Birth control/protection: Other-see comments, Injection    Comment: Pt states she signed consent to get her tubes tied  Other Topics Concern  . Not on file  Social History Narrative  . Not on file   Social Determinants of Health   Financial Resource Strain: Low Risk   . Difficulty of Paying Living Expenses: Not hard at all  Food Insecurity: No Food Insecurity  . Worried About Programme researcher, broadcasting/film/video in the Last  Year: Never true  . Ran Out of Food in the Last Year: Never true  Transportation Needs: No Transportation Needs  . Lack of Transportation (Medical): No  . Lack of Transportation (Non-Medical): No  Physical Activity: Insufficiently Active  . Days of Exercise per Week: 3 days  . Minutes of Exercise per Session: 10 min  Stress: No Stress Concern Present  . Feeling of Stress : Not at all  Social Connections: Slightly Isolated  . Frequency of Communication with Friends and Family: Three times a week  . Frequency of Social Gatherings with Friends and Family: Once a week  . Attends Religious Services: 1 to 4 times per year  . Active Member of Clubs or Organizations: No  . Attends Banker Meetings: Never  . Marital Status: Living with partner  Intimate Partner Violence: Not At Risk  . Fear of Current or Ex-Partner: No  . Emotionally Abused: No  . Physically Abused: No  . Sexually Abused: No    Allergies:  No Known Allergies  Medications: Prior to Admission medications   Medication Sig Start Date End Date Taking? Authorizing Provider  medroxyPROGESTERone (DEPO-PROVERA) 150 MG/ML injection Inject 1 mL (150 mg total) into the muscle once for 1 dose. 03/04/19 03/04/19  Nadara Mustard, MD  Prenatal Vit-Fe Fumarate-FA (MULTIVITAMIN-PRENATAL) 27-0.8 MG TABS tablet Take 1 tablet by mouth daily at 12  noon.    [provider]    Physical Exam Vitals: There were no vitals filed for this visit.  General: NAD HEENT: normocephalic, anicteric Neck: No thyroid enlargement, no palpable nodules, no cervical lymphadenpathy Breast: Lactating, no inflammation, no masses, nipples intact Pulmonary: No increased work of breathing, CTAB Abdomen: Soft, non-tender, non-distended.  Umbilicus without lesions.  No hepatomegaly or masses palpable. No evidence of hernia. Genitourinary:  External: Well healed perineum, no lesions or inflammation    Vagina: Normal vaginal mucosa, no evidence  of prolapse.    Cervix: Grossly normal in appearance, no bleeding  Uterus: Well involuted, mobile, non-tender  Adnexa: No adnexal masses, non-tender  Rectal: deferred Extremities: no edema, erythema, or tenderness Neurologic: Grossly intact Psychiatric: mood appropriate, affect full  Assessment: 24 y.o. S0Y3016 presenting for 6 week postpartum visit  Plan: ***  1) Contraception Education given regarding options for contraception, including {contraceptive options (MU measure 33):20677}. Patient would like to use *** for contraception.  2)  Pap *** - ASCCP guidelines and rational discussed.  Patient opts for *** screening interval.  3) Patient underwent screening for postpartum depression with *** concerns noted.  4) Discussed return to normal activity, recommend continuing prenatal vitamins.  5) Follow up 1 year for routine annual exam.

## 2022-03-02 NOTE — L&D Delivery Note (Signed)
         Delivery Note   Jenna Marks is a 27 y.o. [id number] at [redacted]w[redacted]d Estimated Date of Delivery: 10/21/22  PRE-OPERATIVE DIAGNOSIS:  1) [redacted]w[redacted]d pregnancy.    POST-OPERATIVE DIAGNOSIS:  1) [redacted]w[redacted]d pregnancy s/p Vaginal, Spontaneous   Delivery Type: Vaginal, Spontaneous   Delivery Anesthesia: None  Labor Complications:  none    ESTIMATED BLOOD LOSS: 200 ml    FINDINGS:   1) female infant, Apgar scores of 8   at 1 minute and 9  at 5 minutes and a birthweight pending, infant remains skin to skin     2) Nuchal cord: yes, reduced   SPECIMENS:   PLACENTA:   Appearance: Intact, 3 vessel , cord blood collected    Removal: Spontaneous     Disposition:  per protocol   DISPOSITION:  Infant to left in stable condition in the delivery room, with L&D personnel and mother,  NARRATIVE SUMMARY: Labor course:  [name] is a B2W4132 at [redacted]w[redacted]d who presented for labor management.  She progressed well in labor without pitocin.  She received the appropriate anesthesia and proceeded to complete dilation. She evidenced good maternal expulsive effort during the second stage. She went on to deliver a viable infant. The placenta delivered without problems and was noted to be complete. A perineal and vaginal examination was performed. Episiotomy/Lacerations: None Episiotomy or lacerations were repaired with Vicryl suture using local anesthesia. The patient tolerated this well.  Doreene Burke, CNM  10/26/2022 8:26 AM

## 2022-03-20 ENCOUNTER — Ambulatory Visit (LOCAL_COMMUNITY_HEALTH_CENTER): Payer: Self-pay

## 2022-03-20 VITALS — BP 123/74 | Ht 64.0 in | Wt 171.5 lb

## 2022-03-20 DIAGNOSIS — Z3201 Encounter for pregnancy test, result positive: Secondary | ICD-10-CM

## 2022-03-20 LAB — PREGNANCY, URINE: Preg Test, Ur: POSITIVE — AB

## 2022-03-20 MED ORDER — PRENATAL 27-0.8 MG PO TABS: 1.0000 | ORAL_TABLET | Freq: Every day | ORAL | 0 refills | Status: AC

## 2022-03-20 NOTE — Progress Notes (Signed)
UPT positive. Plans prenatal care at [place].  Wants BTL at delivery.   The patient was dispensed prenatal vitamins #100 today. I provided counseling today regarding the medication. We discussed the medication, the side effects and when to call clinic. Patient given the opportunity to ask questions. Questions answered.    Sent to DSS for medicaid/preg women. Josie Saunders, RN

## 2022-05-04 ENCOUNTER — Encounter: Payer: Self-pay | Admitting: Advanced Practice Midwife

## 2022-05-04 ENCOUNTER — Ambulatory Visit: Payer: Medicaid Other | Admitting: Advanced Practice Midwife

## 2022-05-04 VITALS — BP 113/67 | HR 80 | Temp 97.6°F | Wt 163.0 lb

## 2022-05-04 DIAGNOSIS — Z3482 Encounter for supervision of other normal pregnancy, second trimester: Secondary | ICD-10-CM

## 2022-05-04 DIAGNOSIS — O0932 Supervision of pregnancy with insufficient antenatal care, second trimester: Secondary | ICD-10-CM

## 2022-05-04 DIAGNOSIS — O99012 Anemia complicating pregnancy, second trimester: Secondary | ICD-10-CM

## 2022-05-04 DIAGNOSIS — O09892 Supervision of other high risk pregnancies, second trimester: Secondary | ICD-10-CM

## 2022-05-04 DIAGNOSIS — Z23 Encounter for immunization: Secondary | ICD-10-CM | POA: Diagnosis not present

## 2022-05-04 DIAGNOSIS — Z3480 Encounter for supervision of other normal pregnancy, unspecified trimester: Secondary | ICD-10-CM | POA: Insufficient documentation

## 2022-05-04 DIAGNOSIS — O163 Unspecified maternal hypertension, third trimester: Secondary | ICD-10-CM

## 2022-05-04 DIAGNOSIS — O0930 Supervision of pregnancy with insufficient antenatal care, unspecified trimester: Secondary | ICD-10-CM | POA: Insufficient documentation

## 2022-05-04 DIAGNOSIS — Z2839 Other underimmunization status: Secondary | ICD-10-CM

## 2022-05-04 DIAGNOSIS — O09899 Supervision of other high risk pregnancies, unspecified trimester: Secondary | ICD-10-CM

## 2022-05-04 HISTORY — DX: Anemia complicating pregnancy, second trimester: O99.012

## 2022-05-04 LAB — WET PREP FOR TRICH, YEAST, CLUE
Trichomonas Exam: NEGATIVE
Yeast Exam: NEGATIVE

## 2022-05-04 LAB — HEMOGLOBIN, FINGERSTICK: Hemoglobin: 10.6 g/dL — ABNORMAL LOW (ref 11.1–15.9)

## 2022-05-04 MED ORDER — FERROUS SULFATE 324 (65 FE) MG PO TBEC: 325.0000 mg | DELAYED_RELEASE_TABLET | Freq: Every day | ORAL | 0 refills | Status: DC

## 2022-05-04 NOTE — Progress Notes (Addendum)
Patient here for new OB visit at about 15 5/7. Denies ED visits or other provider visits during this pregnancy. Phone, address, DOB and emergency contact verified. Patient states her VM is set up to receive messages. During health history questioning patient denies having blood transfusion and denies pregnancy induced HTN, however unable to click negative for those conditions. Patient does not know much about her father or his parents. Lives with fiance and their 3 kids. Desires flu vaccine today, MaterniT21 and AFP as well. Hemaglobinopathy will be done as patient unsure of history of her father and his family. Patient is unsure if she's ever had a Pap test..Emmarae Cowdery Wynelle Beckmann, RN   Flu vaccine given, tolerated well, VIS and NCIR given. Patient states she had one Pfizer covid vaccine in 2021, and does not want any more vaccines for covid at this time.Jenetta Downer, RN   Now able to click no transfusion, and patient did have some elevated BP in 2020 pregnancy, HTN in pregnancy on problem list..Baldemar Dady Wynelle Beckmann, RN

## 2022-05-04 NOTE — Progress Notes (Signed)
Patient wet prep reviewed, no treatment indicated. Hgb = 10.6 today. Anemia profile ordered. Patient given anemia in pregnancy pamphlet, counseled on iron rich foods and iron initiated today. Patient counseled to take 1 tablet with vitamin C rich juice, and to take separately from her PNV. Patient will need Hgb checked again at next RV. Patient aware of [place] [facility] at the [facility] on [address]. 05/14/22 at 11:00, and counseled to arrive at 10:45 with full bladder and may take 1 adult with her. Patient states understanding.Jenetta Downer, RN

## 2022-05-04 NOTE — Progress Notes (Signed)
[place] Department  [facility]   INITIAL PRENATAL VISIT NOTE  Subjective:  Jenna Marks is a 27 y.o. engaged WF nonsmoker G4P3003 (4, 3, 1) at 13w5dbeing seen today [facility]. She feels "excited" about planned pregnancy with no birth control. 27yo unemployed FOB feels "the same: excited/happy" about pregnancy and is the father of all of her children, he stays home with the kids while she works 422hrs/wk at [employer] they have been in a 6 year off and on relationship. [place] Unsure LMP maybe 01/14/22? Denies ER use or u/s this pregnancy. Last dental exam 03/2022. Denies cigs, vaping, cigars, MJ. [place] States has never had a pap smear.  She is currently monitored for the following issues for this low-risk pregnancy and has Elevated blood pressure affecting pregnancy in third trimester, 149/85, 145/83; History gestational hypertension 01/2019 with IOL; Late prenatal care 15 5/7; Supervision of other normal pregnancy, antepartum; and history macrosomic infant 9#1 01/2019 on their problem list.  Patient reports no complaints.  Contractions: Not present. Vag. Bleeding: None.  Movement: Absent. Denies leaking of fluid.   Indications for ASA therapy (per uptodate) One of the following: Previous pregnancy with preeclampsia, especially early onset and with an adverse outcome Yes Multifetal gestation No Chronic hypertension No Type 1 or 2 diabetes mellitus No Chronic kidney disease No Autoimmune disease (antiphospholipid syndrome, systemic lupus erythematosus) No  Two or more of the following: Nulliparity No Obesity (body mass index >30 kg/m2) No Family history of preeclampsia in mother or sister No Age ?35 years No Sociodemographic characteristics (African American race, low socioeconomic level) No Personal risk factors (eg, previous  pregnancy with low birth weight or small for gestational age infant, previous adverse pregnancy outcome [eg, stillbirth], interval >10 years between pregnancies) No   The following portions of the patient's history were reviewed and updated as appropriate: allergies, current medications, past family history, past medical history, past social history, past surgical history and problem list. Problem list updated.  Objective:   Vitals:   05/04/22 1332  BP: 113/67  Pulse: 80  Temp: 97.6 F (36.4 C)  Weight: 163 lb (73.9 kg)    Fetal Status: Fetal Heart Rate (bpm): 160 Fundal Height: 18 cm Movement: Absent  Presentation: Undeterminable   Physical Exam Vitals and nursing note reviewed.  Constitutional:      General: She is not in acute distress.    Appearance: Normal appearance. She is well-developed.  HENT:     Head: Normocephalic and atraumatic.     Right Ear: External ear normal.     Left Ear: External ear normal.     Nose: Nose normal. No congestion or rhinorrhea.     Mouth/Throat:     Lips: Pink.     Mouth: Mucous membranes are moist.     Dentition: Normal dentition. No dental caries.     Pharynx: Oropharynx is clear. Uvula midline.     Comments: Dentition: good, last dental exam 03/2022 Eyes:     General: No scleral icterus.    Conjunctiva/sclera: Conjunctivae normal.  Neck:     Thyroid: No thyroid mass, thyromegaly or thyroid tenderness.  Cardiovascular:     Rate and Rhythm: Normal rate.     Pulses: Normal pulses.     Comments: Extremities are warm and well perfused Pulmonary:     Effort: Pulmonary  effort is normal.     Breath sounds: Normal breath sounds.  Chest:     Chest wall: No mass.  Breasts:    Tanner Score is 5.     Breasts are symmetrical.     Right: Normal. No mass, nipple discharge or skin change.     Left: Normal. No mass, nipple discharge or skin change.  Abdominal:     Palpations: Abdomen is soft.     Tenderness: There is no abdominal tenderness.      Comments: Gravid, soft without masses or tenderness, FH=18, FHR=160  Genitourinary:    General: Normal vulva.     Exam position: Lithotomy position.     Pubic Area: No rash.      Labia:        Right: No rash.        Left: No rash.      Vagina: Vaginal discharge (white creamy leukorrhea, ph<4.5) present.     Cervix: Friability (friable to pap) present. No cervical motion tenderness.     Uterus: Enlarged (Gravid 18 wks size). Not tender.      Rectum: Normal. No external hemorrhoid.     Comments: Pap done Musculoskeletal:     Right lower leg: No edema.     Left lower leg: No edema.  Lymphadenopathy:     Cervical: No cervical adenopathy.     Upper Body:     Right upper body: No axillary adenopathy.     Left upper body: No axillary adenopathy.  Skin:    General: Skin is warm.     Capillary Refill: Capillary refill takes less than 2 seconds.  Neurological:     Mental Status: She is alert.    Assessment and Plan:  Pregnancy: G4P3003 at [redacted]w[redacted]d 1. Late prenatal care 15 5/7   2. Supervision of other normal pregnancy, antepartum Desires NIPS and AFP only Counseled on weight gain of 15-25 lbs this pregnancy Dating u/s ordered Treat wet mount per standing orders  - Pregnancy, Initial Screen - Varicella zoster antibody, IgG - Hgb Fractionation Cascade - MaterniT 21 plus Core, Blood - AFP, Serum, Open Spina Bifida -HA:[id number] Screen - Pap IG (Image Guided) - WET PREP FOR TRICH, YEAST, CLUE - Hemoglobin, venipuncture  3. history macrosomic infant 9#1 01/2019   5. Elevated blood pressure affecting pregnancy in third trimester, 149/85, 145/83 01/2019 To monitor    Discussed overview of care and coordination with inpatient delivery practices including [facility]   Reviewed Centering pregnancy as standard of care at [facility]   Preterm labor symptoms and general obstetric precautions including but not limited to vaginal bleeding,  contractions, leaking of fluid and fetal movement were reviewed in detail with the patient.  Please refer to After Visit Summary for other counseling recommendations.   Return in about 4 weeks (around 06/01/2022) for routine PNC.  No future appointments.  EHerbie Saxon CNM

## 2022-05-05 LAB — FE+CBC/D/PLT+TIBC+FER+RETIC
Basophils Absolute: 0 10*3/uL (ref 0.0–0.2)
Basos: 0 %
EOS (ABSOLUTE): 0.2 10*3/uL (ref 0.0–0.4)
Eos: 3 %
Ferritin: 62 ng/mL (ref 15–150)
Hematocrit: 31.5 % — ABNORMAL LOW (ref 34.0–46.6)
Hemoglobin: 10.5 g/dL — ABNORMAL LOW (ref 11.1–15.9)
Immature Grans (Abs): 0 10*3/uL (ref 0.0–0.1)
Immature Granulocytes: 1 %
Iron Saturation: 15 % (ref 15–55)
Iron: 62 ug/dL (ref 27–159)
Lymphocytes Absolute: 2.4 10*3/uL (ref 0.7–3.1)
Lymphs: 38 %
MCH: 29.6 pg (ref 26.6–33.0)
MCHC: 33.3 g/dL (ref 31.5–35.7)
MCV: 89 fL (ref 79–97)
Monocytes Absolute: 0.4 10*3/uL (ref 0.1–0.9)
Monocytes: 7 %
Neutrophils Absolute: 3.2 10*3/uL (ref 1.4–7.0)
Neutrophils: 51 %
Platelets: 225 10*3/uL (ref 150–450)
RBC: 3.55 x10E6/uL — ABNORMAL LOW (ref 3.77–5.28)
RDW: 16 % — ABNORMAL HIGH (ref 11.7–15.4)
Retic Ct Pct: 2.8 % — ABNORMAL HIGH (ref 0.6–2.6)
Total Iron Binding Capacity: 410 ug/dL (ref 250–450)
UIBC: 348 ug/dL (ref 131–425)
WBC: 6.2 10*3/uL (ref 3.4–10.8)

## 2022-05-06 LAB — 789231 7+OXYCODONE-BUND
Amphetamines, Urine: NEGATIVE ng/mL
BENZODIAZ UR QL: NEGATIVE ng/mL
Barbiturate screen, urine: NEGATIVE ng/mL
Cannabinoid Quant, Ur: NEGATIVE ng/mL
Cocaine (Metab.): NEGATIVE ng/mL
OPIATE SCREEN URINE: NEGATIVE ng/mL
Oxycodone/Oxymorphone, Urine: NEGATIVE ng/mL
PCP Quant, Ur: NEGATIVE ng/mL

## 2022-05-08 LAB — AFP, SERUM, OPEN SPINA BIFIDA
AFP MoM: 0.93
AFP Value: 27.7 ng/mL
Gest. Age on Collection Date: 15.5 weeks
Maternal Age At EDD: 24.7 yr
OSBR Risk 1 IN: 10000
Test Results:: NEGATIVE
Weight: 163 [lb_av]

## 2022-05-08 LAB — HCV INTERPRETATION

## 2022-05-08 LAB — PREGNANCY, INITIAL SCREEN
Antibody Screen: NEGATIVE
Basophils Absolute: 0 10*3/uL (ref 0.0–0.2)
Basos: 0 %
Bilirubin, UA: NEGATIVE
Chlamydia trachomatis, NAA: NEGATIVE
EOS (ABSOLUTE): 0.2 10*3/uL (ref 0.0–0.4)
Eos: 3 %
Glucose, UA: NEGATIVE
HCV Ab: NONREACTIVE
HIV Screen 4th Generation wRfx: NONREACTIVE
Hematocrit: 30.5 % — ABNORMAL LOW (ref 34.0–46.6)
Hemoglobin: 10.6 g/dL — ABNORMAL LOW (ref 11.1–15.9)
Hepatitis B Surface Ag: NEGATIVE
Immature Grans (Abs): 0 10*3/uL (ref 0.0–0.1)
Immature Granulocytes: 1 %
Ketones, UA: NEGATIVE
Lymphocytes Absolute: 2.5 10*3/uL (ref 0.7–3.1)
Lymphs: 39 %
MCH: 30.3 pg (ref 26.6–33.0)
MCHC: 34.8 g/dL (ref 31.5–35.7)
MCV: 87 fL (ref 79–97)
Monocytes Absolute: 0.4 10*3/uL (ref 0.1–0.9)
Monocytes: 6 %
Neisseria Gonorrhoeae by PCR: NEGATIVE
Neutrophils Absolute: 3.3 10*3/uL (ref 1.4–7.0)
Neutrophils: 51 %
Nitrite, UA: NEGATIVE
Platelets: 222 10*3/uL (ref 150–450)
RBC, UA: NEGATIVE
RBC: 3.5 x10E6/uL — ABNORMAL LOW (ref 3.77–5.28)
RDW: 16.1 % — ABNORMAL HIGH (ref 11.7–15.4)
RPR Ser Ql: NONREACTIVE
Rh Factor: POSITIVE
Rubella Antibodies, IGG: 1.17 index (ref >0.99)
Specific Gravity, UA: 1.021 (ref 1.005–1.030)
Urobilinogen, Ur: 1 mg/dL (ref 0.2–1.0)
WBC: 6.4 10*3/uL (ref 3.4–10.8)
pH, UA: 7 (ref 5.0–7.5)

## 2022-05-08 LAB — MICROSCOPIC EXAMINATION
Bacteria, UA: NONE SEEN
Casts: NONE SEEN /lpf

## 2022-05-08 LAB — HGB FRACTIONATION CASCADE
Hgb A2: 2.5 % (ref 1.8–3.2)
Hgb A: 97.5 % (ref 96.4–98.8)
Hgb F: 0 % (ref 0.0–2.0)
Hgb S: 0 %

## 2022-05-08 LAB — VARICELLA ZOSTER ANTIBODY, IGG: Varicella zoster IgG: <135 index — ABNORMAL LOW (ref >165)

## 2022-05-08 LAB — MATERNIT 21 PLUS CORE, BLOOD
Fetal Fraction: 8
Result (T21): NEGATIVE
Trisomy 13 (Patau syndrome): NEGATIVE
Trisomy 18 (Edwards syndrome): NEGATIVE
Trisomy 21 (Down syndrome): NEGATIVE

## 2022-05-08 LAB — PAP IG (IMAGE GUIDED): PAP Smear Comment: 0

## 2022-05-08 LAB — URINE CULTURE, OB REFLEX: Organism ID, Bacteria: NO GROWTH

## 2022-05-11 DIAGNOSIS — Z2839 Other underimmunization status: Secondary | ICD-10-CM | POA: Insufficient documentation

## 2022-05-11 DIAGNOSIS — O09899 Supervision of other high risk pregnancies, unspecified trimester: Secondary | ICD-10-CM | POA: Insufficient documentation

## 2022-05-14 ENCOUNTER — Ambulatory Visit
Admission: RE | Admit: 2022-05-14 | Discharge: 2022-05-14 | Disposition: A | Discharge status: Home / Self Care | Payer: Medicaid Other | Source: Ambulatory Visit | Attending: Advanced Practice Midwife | Admitting: Advanced Practice Midwife

## 2022-05-14 DIAGNOSIS — Z3480 Encounter for supervision of other normal pregnancy, unspecified trimester: Secondary | ICD-10-CM | POA: Insufficient documentation

## 2022-05-14 DIAGNOSIS — Z3A16 16 weeks gestation of pregnancy: Secondary | ICD-10-CM | POA: Insufficient documentation

## 2022-05-14 DIAGNOSIS — Z3689 Encounter for other specified antenatal screening: Secondary | ICD-10-CM | POA: Insufficient documentation

## 2022-06-01 ENCOUNTER — Ambulatory Visit: Payer: Medicaid Other

## 2022-06-04 ENCOUNTER — Encounter: Payer: Self-pay | Admitting: Family Medicine

## 2022-06-04 ENCOUNTER — Ambulatory Visit: Payer: Medicaid Other | Admitting: Family Medicine

## 2022-06-04 VITALS — BP 120/70 | HR 85 | Temp 97.9°F | Wt 165.0 lb

## 2022-06-04 DIAGNOSIS — Z3A20 20 weeks gestation of pregnancy: Secondary | ICD-10-CM

## 2022-06-04 DIAGNOSIS — Z3480 Encounter for supervision of other normal pregnancy, unspecified trimester: Secondary | ICD-10-CM

## 2022-06-04 DIAGNOSIS — O99012 Anemia complicating pregnancy, second trimester: Secondary | ICD-10-CM

## 2022-06-04 DIAGNOSIS — Z3482 Encounter for supervision of other normal pregnancy, second trimester: Secondary | ICD-10-CM

## 2022-06-04 LAB — HEMOGLOBIN, FINGERSTICK: Hemoglobin: 9.4 g/dL — ABNORMAL LOW (ref 11.1–15.9)

## 2022-06-04 NOTE — Progress Notes (Signed)
Hgb = 9.4 today, patient counseled to increase her iron to 3 x/day with vitamin C rich juice. Jenetta Downer, RN

## 2022-06-04 NOTE — Progress Notes (Signed)
[place] [facility]  PRENATAL VISIT NOTE  Subjective:  Jenna Marks is a 27 y.o. [phone] at [redacted]w[redacted]d being seen today for ongoing prenatal care.  She is currently monitored for the following issues for this low-risk pregnancy and has History gestational hypertension 01/2019 with IOL; Late prenatal care 15 5/7; Supervision of other normal pregnancy, antepartum; history macrosomic infant 9#1 01/2019; Anemia affecting pregnancy in second trimester; and Susceptible to varicella (non-immune), currently pregnant on their problem list.  Patient reports no complaints.  Contractions: Not present. Vag. Bleeding: None.  Movement: Absent. Denies leaking of fluid/ROM.   The following portions of the patient's history were reviewed and updated as appropriate: allergies, current medications, past family history, past medical history, past social history, past surgical history and problem list. Problem list updated.  Objective:   Vitals:   06/04/22 1558  BP: 120/70  Pulse: 85  Temp: 97.9 F (36.6 C)  Weight: 74.8 kg    Fetal Status: Fetal Heart Rate (bpm): 150 Fundal Height: 20 cm Movement: Absent     General:  Alert, oriented and cooperative. Patient is in no acute distress.  Skin: Skin is warm and dry. No rash noted.   Cardiovascular: Normal heart rate noted  Respiratory: Normal respiratory effort, no problems with respiration noted  Abdomen: Soft, gravid, appropriate for gestational age.  Pain/Pressure: Absent     Pelvic: Cervical exam deferred        Extremities: Normal range of motion.  Edema: None  Mental Status: Normal mood and affect. Normal behavior. Normal judgment and thought content.   Assessment and Plan:  Pregnancy: G4P3003 at [redacted]w[redacted]d  1. Supervision of other normal pregnancy, antepartum -PNV-taking daily  4.536 kg -order repeat anatomy   2. Anemia affecting pregnancy in second trimester -recheck Hgb today -iron-taking daily  3. [redacted]  weeks gestation of pregnancy    Preterm labor symptoms and general obstetric precautions including but not limited to vaginal bleeding, contractions, leaking of fluid and fetal movement were reviewed in detail with the patient. Please refer to After Visit Summary for other counseling recommendations.  Return in about 4 weeks (around 07/02/2022) for Routine Prenatal Care.  No future appointments.   Sharlet Salina, [place]

## 2022-06-15 NOTE — Addendum Note (Signed)
Addended by: Tere Mcconaughey on: 06/15/2022 01:46 PM   Modules accepted: Orders  

## 2022-07-01 ENCOUNTER — Ambulatory Visit: Payer: Medicaid Other

## 2022-07-02 ENCOUNTER — Ambulatory Visit: Payer: Medicaid Other

## 2022-07-02 ENCOUNTER — Encounter: Payer: Self-pay | Admitting: Physician Assistant

## 2022-07-02 ENCOUNTER — Ambulatory Visit: Payer: Medicaid Other | Admitting: Physician Assistant

## 2022-07-02 VITALS — BP 115/69 | HR 96 | Temp 97.4°F | Wt 169.8 lb

## 2022-07-02 DIAGNOSIS — Z3480 Encounter for supervision of other normal pregnancy, unspecified trimester: Secondary | ICD-10-CM

## 2022-07-02 DIAGNOSIS — Z3482 Encounter for supervision of other normal pregnancy, second trimester: Secondary | ICD-10-CM

## 2022-07-02 DIAGNOSIS — O99012 Anemia complicating pregnancy, second trimester: Secondary | ICD-10-CM

## 2022-07-02 DIAGNOSIS — Z3A24 24 weeks gestation of pregnancy: Secondary | ICD-10-CM

## 2022-07-02 LAB — HEMOGLOBIN, FINGERSTICK: Hemoglobin: 12.6 g/dL (ref 11.1–15.9)

## 2022-07-02 NOTE — Progress Notes (Signed)
Client correctly verbalized how to take iron and prenatal vitamin. Only taking iron BID as often forgets to take while at work. Counseled to take TID with last dose at bedtime. Taking iron tablet with water or juice. Counseled to take with juice always to aid in absorption of iron. BTL consent signed today. Copy labeled and sent for scanning, copy given to client and copy slid under door. Original placed in accordion folder in [facility] nurse clinic workroom. Jossie Ng, RN Based on hgb result today, A. Streilein PA-C decreased iron 325 mg po from TID to QD. Client counseled regarding the change and voiced understanding. Jossie Ng, RN

## 2022-07-02 NOTE — Progress Notes (Signed)
[facility]  PRENATAL VISIT NOTE  Subjective:  Jenna Marks is a 27 y.o. [phone] at [redacted]w[redacted]d being seen today for ongoing prenatal care.  She is currently monitored for the following issues for this low-risk pregnancy and has History gestational hypertension 01/2019 with IOL; Late prenatal care 15 5/7; Supervision of other normal pregnancy, antepartum; history macrosomic infant 9#1 01/2019; Anemia affecting pregnancy in second trimester; and Susceptible to varicella (non-immune), currently pregnant on their problem list.  Patient reports no complaints.  Contractions: Not present. Vag. Bleeding: None.  Movement: Present. Denies leaking of fluid/ROM.   The following portions of the patient's history were reviewed and updated as appropriate: allergies, current medications, past family history, past medical history, past social history, past surgical history and problem list. Problem list updated.  Objective:   Vitals:   07/02/22 1628  BP: 115/69  Pulse: 96  Temp: (!) 97.4 F (36.3 C)  Weight: 169 lb 12.8 oz (77 kg)    Fetal Status: Fetal Heart Rate (bpm): 156 Fundal Height: 24 cm Movement: Present     General:  Alert, oriented and cooperative. Patient is in no acute distress.  Skin: Skin is warm and dry. No rash noted.   Cardiovascular: Normal heart rate noted  Respiratory: Normal respiratory effort, no problems with respiration noted  Abdomen: Soft, gravid, appropriate for gestational age.  Pain/Pressure: Absent     Pelvic: Cervical exam deferred        Extremities: Normal range of motion.  Edema: None  Mental Status: Normal mood and affect. Normal behavior. Normal judgment and thought content.   Assessment and Plan:  Pregnancy: G4P3003 at [redacted]w[redacted]d  1. Supervision of other normal pregnancy, antepartum To verify that f/u fetal US is scheduled to complete anatomy scan - several structures not adequately visualized on initial scan due to  early gestational age (approx 17 weeks). Desires BTL for pp contraception, consent signed today.  2. Anemia affecting pregnancy in second trimester Hgb 12.g g/dL today. Decrease oral iron to once daily. - Hemoglobin, venipuncture  3. [redacted] weeks gestation of pregnancy Anticipatory guidance re: routine labs at next visit, including GDM screen.   Preterm labor symptoms and general obstetric precautions including but not limited to vaginal bleeding, contractions, leaking of fluid and fetal movement were reviewed in detail with the patient. Please refer to After Visit Summary for other counseling recommendations.  Return in about 3 weeks (around 07/23/2022) for Routine prenatal care, 28 wk labs (before 10:30am or 3pm).  Future Appointments  Date Time Provider Department Center  07/22/2022  8:40 AM [facility] PROVIDER [facility] None    Landry Dyke, PA-C

## 2022-07-03 ENCOUNTER — Telehealth: Payer: Self-pay

## 2022-07-03 NOTE — Telephone Encounter (Signed)
Call to client with [place] appt. Counseled to arrive at [facility] on 07/08/2022 at 1045 ([place] at 1100) with a full bladder. Facility address provided to client. Jossie Ng, RN

## 2022-07-08 ENCOUNTER — Ambulatory Visit
Admission: RE | Admit: 2022-07-08 | Discharge: 2022-07-08 | Disposition: A | Discharge status: Home / Self Care | Payer: Medicaid Other | Source: Ambulatory Visit | Attending: Family Medicine | Admitting: Family Medicine

## 2022-07-08 DIAGNOSIS — Z3A00 Weeks of gestation of pregnancy not specified: Secondary | ICD-10-CM | POA: Insufficient documentation

## 2022-07-08 DIAGNOSIS — O321XX0 Maternal care for breech presentation, not applicable or unspecified: Secondary | ICD-10-CM | POA: Insufficient documentation

## 2022-07-08 DIAGNOSIS — Z3480 Encounter for supervision of other normal pregnancy, unspecified trimester: Secondary | ICD-10-CM

## 2022-07-08 DIAGNOSIS — Z3689 Encounter for other specified antenatal screening: Secondary | ICD-10-CM | POA: Insufficient documentation

## 2022-07-21 NOTE — Progress Notes (Unsigned)
[facility]  PRENATAL VISIT NOTE  Subjective:  Jenna Marks is a 27 y.o. [phone] at [redacted]w[redacted]d being seen today for ongoing prenatal care.  She is currently monitored for the following issues for this low-risk pregnancy and has History gestational hypertension 01/2019 with IOL; Late prenatal care 15 5/7; Supervision of other normal pregnancy, antepartum; history macrosomic infant 9#1 01/2019; Anemia affecting pregnancy in second trimester; and Susceptible to varicella (non-immune), currently pregnant on their problem list.  Patient reports {sx:14538}.   .  .   . ***Denies leaking of fluid/ROM.   The following portions of the patient's history were reviewed and updated as appropriate: allergies, current medications, past family history, past medical history, past social history, past surgical history and problem list. Problem list updated.  Objective:  There were no vitals filed for this visit.  Fetal Status:           General:  Alert, oriented and cooperative. Patient is in no acute distress.  Skin: Skin is warm and dry. No rash noted.   Cardiovascular: Normal heart rate noted  Respiratory: Normal respiratory effort, no problems with respiration noted  Abdomen: Soft, gravid, appropriate for gestational age.        Pelvic: {Blank single:19197::"Cervical exam performed","Cervical exam deferred"}        Extremities: Normal range of motion.     Mental Status: Normal mood and affect. Normal behavior. Normal judgment and thought content.   Assessment and Plan:  Pregnancy: G4P3003 at [redacted]w[redacted]d  1. Supervision of other normal pregnancy, antepartum -reviewed 07/08/22 [place]- posterior placenta, AFV wnl, 3VC, anatomy wnl -taking PNV daily? -weight -exercise  2. Anemia affecting pregnancy in second trimester -taking iron daily with juice? -last Hgb 5/2= 12.6  3. [redacted] weeks gestation of pregnancy ***    Preterm labor symptoms and general obstetric  precautions including but not limited to vaginal bleeding, contractions, leaking of fluid and fetal movement were reviewed in detail with the patient. Please refer to After Visit Summary for other counseling recommendations.  No follow-ups on file.  Future Appointments  Date Time Provider Department Center  07/22/2022  8:40 AM [facility] PROVIDER [facility] None    Lenice Llamas, FNP

## 2022-07-22 ENCOUNTER — Ambulatory Visit: Payer: Medicaid Other | Admitting: Family Medicine

## 2022-07-22 ENCOUNTER — Encounter: Payer: Self-pay | Admitting: Family Medicine

## 2022-07-22 VITALS — BP 105/70 | HR 86 | Temp 97.1°F | Wt 171.6 lb

## 2022-07-22 DIAGNOSIS — Z23 Encounter for immunization: Secondary | ICD-10-CM

## 2022-07-22 DIAGNOSIS — Z3480 Encounter for supervision of other normal pregnancy, unspecified trimester: Secondary | ICD-10-CM

## 2022-07-22 DIAGNOSIS — Z3A27 27 weeks gestation of pregnancy: Secondary | ICD-10-CM

## 2022-07-22 DIAGNOSIS — Z3483 Encounter for supervision of other normal pregnancy, third trimester: Secondary | ICD-10-CM

## 2022-07-22 DIAGNOSIS — O99012 Anemia complicating pregnancy, second trimester: Secondary | ICD-10-CM

## 2022-07-22 LAB — HEMOGLOBIN, FINGERSTICK: Hemoglobin: 11 g/dL — ABNORMAL LOW (ref 11.1–15.9)

## 2022-07-22 NOTE — Progress Notes (Signed)
Patient here for MH RV at 27 weeks. 28 week labs and Tdap today. Patient needs to be to lab by 10:15 for blood draw.Burt Knack, RN

## 2022-07-22 NOTE — Progress Notes (Signed)
Tdap given, tolerated well, VIS given and NCIR mailed to patient. Hgb = 11.0 today, patient counseled to continue taking her iron once daily with vitamin C rich juice. Patient sent to clerical to schedule 2 week revisit.Burt Knack, RN

## 2022-07-24 LAB — RPR: RPR Ser Ql: NONREACTIVE

## 2022-07-24 LAB — GLUCOSE, 1 HOUR GESTATIONAL: Gestational Diabetes Screen: 100 mg/dL (ref 70–139)

## 2022-07-24 LAB — HIV-1/HIV-2 QUALITATIVE RNA
HIV-1 RNA, Qualitative: NONREACTIVE
HIV-2 RNA, Qualitative: NONREACTIVE

## 2022-08-05 ENCOUNTER — Ambulatory Visit: Payer: Medicaid Other | Admitting: Advanced Practice Midwife

## 2022-08-05 VITALS — BP 116/68 | HR 98 | Temp 97.6°F | Wt 173.2 lb

## 2022-08-05 DIAGNOSIS — Z3480 Encounter for supervision of other normal pregnancy, unspecified trimester: Secondary | ICD-10-CM

## 2022-08-05 DIAGNOSIS — O99012 Anemia complicating pregnancy, second trimester: Secondary | ICD-10-CM

## 2022-08-05 DIAGNOSIS — Z3483 Encounter for supervision of other normal pregnancy, third trimester: Secondary | ICD-10-CM

## 2022-08-05 DIAGNOSIS — O99013 Anemia complicating pregnancy, third trimester: Secondary | ICD-10-CM

## 2022-08-05 DIAGNOSIS — O0933 Supervision of pregnancy with insufficient antenatal care, third trimester: Secondary | ICD-10-CM

## 2022-08-05 DIAGNOSIS — O0930 Supervision of pregnancy with insufficient antenatal care, unspecified trimester: Secondary | ICD-10-CM

## 2022-08-05 DIAGNOSIS — O139 Gestational [pregnancy-induced] hypertension without significant proteinuria, unspecified trimester: Secondary | ICD-10-CM

## 2022-08-05 NOTE — Progress Notes (Signed)
Correctly verbalizes how to take iron tablet and PNV> Taking iron tablet with juice. Jossie Ng, RN

## 2022-08-05 NOTE — Progress Notes (Signed)
[facility]  PRENATAL VISIT NOTE  Subjective:  Jenna Marks is a 27 y.o. [phone] at [redacted]w[redacted]d being seen today for ongoing prenatal care.  She is currently monitored for the following issues for this low-risk pregnancy and has History gestational hypertension 01/2019 with IOL; Late prenatal care 15 5/7; Supervision of other normal pregnancy, antepartum; history macrosomic infant 9#1 01/2019; Anemia affecting pregnancy in second trimester; and Susceptible to varicella (non-immune), currently pregnant on their problem list.  Patient reports no complaints.  Contractions: Not present. Vag. Bleeding: None.  Movement: Present. Denies leaking of fluid/ROM.   The following portions of the patient's history were reviewed and updated as appropriate: allergies, current medications, past family history, past medical history, past social history, past surgical history and problem list. Problem list updated.  Objective:   Vitals:   08/05/22 0859  BP: 116/68  Pulse: 98  Temp: 97.6 F (36.4 C)  Weight: 173 lb 3.2 oz (78.6 kg)    Fetal Status: Fetal Heart Rate (bpm): 124 Fundal Height: 30 cm Movement: Present     General:  Alert, oriented and cooperative. Patient is in no acute distress.  Skin: Skin is warm and dry. No rash noted.   Cardiovascular: Normal heart rate noted  Respiratory: Normal respiratory effort, no problems with respiration noted  Abdomen: Soft, gravid, appropriate for gestational age.  Pain/Pressure: Absent     Pelvic: Cervical exam deferred        Extremities: Normal range of motion.  Edema: None  Mental Status: Normal mood and affect. Normal behavior. Normal judgment and thought content.   Assessment and Plan:  Pregnancy: G4P3003 at [redacted]w[redacted]d  1. Supervision of other normal pregnancy, antepartum 18 lb 3.2 oz (8.255 kg) No crib or car seat yet 1 hour glucola=100 on 07/22/22 Not exercising; encouraged to do so  R. Marlan Palau, LCSW in to  talk to pt Working 40 hrs/wk  2. Late prenatal care 15 5/7  3. Anemia affecting pregnancy in second trimester Taking FeSo4 I daily with oj  4. Gestational hypertension, antepartum 116/68 today  Preterm labor symptoms and general obstetric precautions including but not limited to vaginal bleeding, contractions, leaking of fluid and fetal movement were reviewed in detail with the patient. Please refer to After Visit Summary for other counseling recommendations.  Return in about 2 weeks (around 08/19/2022) for routine PNC.  No future appointments.  Alberteen Spindle, CNM

## 2022-08-18 NOTE — Progress Notes (Unsigned)
Erroneous- disregard

## 2022-08-19 ENCOUNTER — Encounter: Payer: Medicaid Other | Admitting: Family Medicine

## 2022-08-19 ENCOUNTER — Telehealth: Payer: Self-pay

## 2022-08-19 DIAGNOSIS — Z3A31 31 weeks gestation of pregnancy: Secondary | ICD-10-CM

## 2022-08-19 DIAGNOSIS — Z3480 Encounter for supervision of other normal pregnancy, unspecified trimester: Secondary | ICD-10-CM

## 2022-08-19 DIAGNOSIS — O99012 Anemia complicating pregnancy, second trimester: Secondary | ICD-10-CM

## 2022-08-19 NOTE — Telephone Encounter (Signed)
Patient did not arrive to today's OB appointment. Called and left voice message for patient to call and reschedule appointment at [phone]. BTHIELE RN

## 2022-08-24 NOTE — Telephone Encounter (Signed)
Call to client to reschedule missed [facility] RV appt. Appt scheduled for 626/2024 with arrival time of 1245. Jossie Ng, RN

## 2022-08-26 ENCOUNTER — Ambulatory Visit: Payer: Medicaid Other | Admitting: Family Medicine

## 2022-08-26 VITALS — BP 107/58 | HR 86 | Temp 98.2°F | Wt 174.6 lb

## 2022-08-26 DIAGNOSIS — O99013 Anemia complicating pregnancy, third trimester: Secondary | ICD-10-CM

## 2022-08-26 DIAGNOSIS — Z3480 Encounter for supervision of other normal pregnancy, unspecified trimester: Secondary | ICD-10-CM

## 2022-08-26 DIAGNOSIS — Z3483 Encounter for supervision of other normal pregnancy, third trimester: Secondary | ICD-10-CM

## 2022-08-26 DIAGNOSIS — O99012 Anemia complicating pregnancy, second trimester: Secondary | ICD-10-CM

## 2022-08-26 NOTE — Progress Notes (Signed)
[facility]  PRENATAL VISIT NOTE  Subjective:  Jenna Marks is a 27 y.o. [phone] at [redacted]w[redacted]d being seen today for ongoing prenatal care.  She is currently monitored for the following issues for this low-risk pregnancy and has History gestational hypertension 01/2019 with IOL; Late prenatal care 15 5/7; Supervision of other normal pregnancy, antepartum; history macrosomic infant 9#1 01/2019; Anemia affecting pregnancy in second trimester; and Susceptible to varicella (non-immune), currently pregnant on their problem list.  Patient reports no complaints.  Contractions: Not present. Vag. Bleeding: None.  Movement: Present. Denies leaking of fluid/ROM.   The following portions of the patient's history were reviewed and updated as appropriate: allergies, current medications, past family history, past medical history, past social history, past surgical history and problem list. Problem list updated.  Objective:   Vitals:   08/26/22 1300  BP: (!) 107/58  Pulse: 86  Temp: 98.2 F (36.8 C)  Weight: 174 lb 9.6 oz (79.2 kg)    Fetal Status: Fetal Heart Rate (bpm): 141 Fundal Height: 33 cm Movement: Present     General:  Alert, oriented and cooperative. Patient is in no acute distress.  Skin: Skin is warm and dry. No rash noted.   Cardiovascular: Normal heart rate noted  Respiratory: Normal respiratory effort, no problems with respiration noted  Abdomen: Soft, gravid, appropriate for gestational age.  Pain/Pressure: Present     Pelvic: Cervical exam deferred        Extremities: Normal range of motion.  Edema: None  Mental Status: Normal mood and affect. Normal behavior. Normal judgment and thought content.   Assessment and Plan:  Pregnancy: G4P3003 at [redacted]w[redacted]d  1. Supervision of other normal pregnancy, antepartum -taking PNV daily? 19 lb 9.6 oz (8.891 kg) -doing well, no concerns -FMLA papers given today -pt states she thinks she will take  off 6 weeks due to finances  2. Anemia affecting pregnancy in second trimester -taking iron daily with juice    Preterm labor symptoms and general obstetric precautions including but not limited to vaginal bleeding, contractions, leaking of fluid and fetal movement were reviewed in detail with the patient. Please refer to After Visit Summary for other counseling recommendations.  Return in about 2 weeks (around 09/09/2022) for Routine Prenatal Care.  No future appointments.  Lenice Llamas, [place]

## 2022-08-26 NOTE — Progress Notes (Signed)
2 week MHC RV appt scheduled for 0920 09/09/22 with arrival time of 0900. Client agreeable with appt time and appt reminder card given. Jossie Ng, RN

## 2022-08-26 NOTE — Progress Notes (Signed)
Here today for 31.6 week MH RV. Taking PNV and Iron every day. Denies ED/hospital visits since last RV. Kick count cards and instructions given. Completed FMLA papers given and copy faxed to employer. Tawny Hopping, RN

## 2022-09-08 NOTE — Progress Notes (Signed)
[facility]  PRENATAL VISIT NOTE  Subjective:  Jenna Marks is a 27 y.o. [phone] at [redacted]w[redacted]d being seen today for ongoing prenatal care.  She is currently monitored for the following issues for this low-risk pregnancy and has History gestational hypertension 01/2019 with IOL; Late prenatal care 15 5/7; Supervision of other normal pregnancy, antepartum; history macrosomic infant 9#1 01/2019; Anemia affecting pregnancy in second trimester; and Susceptible to varicella (non-immune), currently pregnant on their problem list.  Patient reports no complaints.  Contractions: Not present. Vag. Bleeding: None.  Movement: Present. Denies leaking of fluid/ROM.   The following portions of the patient's history were reviewed and updated as appropriate: allergies, current medications, past family history, past medical history, past social history, past surgical history and problem list. Problem list updated.  Objective:   Vitals:   09/09/22 0913  BP: 121/67  Pulse: 89  Temp: (!) 97 F (36.1 C)  Weight: 174 lb 6.4 oz (79.1 kg)    Fetal Status: Fetal Heart Rate (bpm): 147 Fundal Height: 34 cm Movement: Present     General:  Alert, oriented and cooperative. Patient is in no acute distress.  Skin: Skin is warm and dry. No rash noted.   Cardiovascular: Normal heart rate noted  Respiratory: Normal respiratory effort, no problems with respiration noted  Abdomen: Soft, gravid, appropriate for gestational age.  Pain/Pressure: Present     Pelvic: Cervical exam deferred        Extremities: Normal range of motion.  Edema: None  Mental Status: Normal mood and affect. Normal behavior. Normal judgment and thought content.   Assessment and Plan:  Pregnancy: G4P3003 at [redacted]w[redacted]d  1. Supervision of other normal pregnancy, antepartum -taking PNV daily 19 lb 6.4 oz (8.8 kg) -exercise-walking at work  -reviewed papers for work that she wanted filled out   2. Anemia  affecting pregnancy in second trimester -taking iron daily with juice -last Hgb 11.0 on 5/22  3. [redacted] weeks gestation of pregnancy -counseled on standard labs at 36 weeks   Preterm labor symptoms and general obstetric precautions including but not limited to vaginal bleeding, contractions, leaking of fluid and fetal movement were reviewed in detail with the patient. Please refer to After Visit Summary for other counseling recommendations.  Return in about 2 weeks (around 09/23/2022) for Routine Prenatal Care.  No future appointments.  Lenice Llamas, [place]

## 2022-09-09 ENCOUNTER — Ambulatory Visit: Payer: Medicaid Other | Admitting: Family Medicine

## 2022-09-09 ENCOUNTER — Encounter: Payer: Self-pay | Admitting: Family Medicine

## 2022-09-09 VITALS — BP 121/67 | HR 89 | Temp 97.0°F | Wt 174.4 lb

## 2022-09-09 DIAGNOSIS — Z3483 Encounter for supervision of other normal pregnancy, third trimester: Secondary | ICD-10-CM

## 2022-09-09 DIAGNOSIS — O99012 Anemia complicating pregnancy, second trimester: Secondary | ICD-10-CM

## 2022-09-09 DIAGNOSIS — Z3480 Encounter for supervision of other normal pregnancy, unspecified trimester: Secondary | ICD-10-CM

## 2022-09-09 DIAGNOSIS — Z3A34 34 weeks gestation of pregnancy: Secondary | ICD-10-CM

## 2022-09-09 DIAGNOSIS — O99013 Anemia complicating pregnancy, third trimester: Secondary | ICD-10-CM

## 2022-09-09 NOTE — Progress Notes (Signed)
Here today for 34.0 week MH RV. Taking PNV and Iron every day. Denies ED/hospital visits since last RV. Completed FMLA papers given and copy faxed to employer. Tawny Hopping, RN

## 2022-09-21 NOTE — Addendum Note (Signed)
Addended by: Heywood Bene on: 09/21/2022 09:08 AM   Modules accepted: Orders

## 2022-09-23 ENCOUNTER — Ambulatory Visit: Payer: Medicaid Other | Admitting: Advanced Practice Midwife

## 2022-09-23 VITALS — BP 120/70 | HR 86 | Temp 97.1°F | Wt 177.2 lb

## 2022-09-23 DIAGNOSIS — Z3483 Encounter for supervision of other normal pregnancy, third trimester: Secondary | ICD-10-CM

## 2022-09-23 DIAGNOSIS — O0933 Supervision of pregnancy with insufficient antenatal care, third trimester: Secondary | ICD-10-CM

## 2022-09-23 DIAGNOSIS — O99013 Anemia complicating pregnancy, third trimester: Secondary | ICD-10-CM

## 2022-09-23 DIAGNOSIS — O0930 Supervision of pregnancy with insufficient antenatal care, unspecified trimester: Secondary | ICD-10-CM

## 2022-09-23 DIAGNOSIS — O99012 Anemia complicating pregnancy, second trimester: Secondary | ICD-10-CM

## 2022-09-23 DIAGNOSIS — Z3480 Encounter for supervision of other normal pregnancy, unspecified trimester: Secondary | ICD-10-CM

## 2022-09-23 NOTE — Progress Notes (Signed)
1 week MHC RV appt scheduled for 09/30/22 and Royden Purl, [facility] intake clerk gave client appt reminder card. Jossie Ng, RN

## 2022-09-23 NOTE — Progress Notes (Signed)
[facility]  PRENATAL VISIT NOTE  Subjective:  Jenna Marks is a 26 y.o. [phone] at [redacted]w[redacted]d being seen today for ongoing prenatal care.  She is currently monitored for the following issues for this low-risk pregnancy and has History gestational hypertension 01/2019 with IOL; Late prenatal care 15 5/7; Supervision of other normal pregnancy, antepartum; history macrosomic infant 9#1 01/2019; Anemia affecting pregnancy in second trimester; and Susceptible to varicella (non-immune), currently pregnant on their problem list.  Patient reports no complaints.  Contractions: Not present. Vag. Bleeding: None.  Movement: Present. Denies leaking of fluid/ROM.   The following portions of the patient's history were reviewed and updated as appropriate: allergies, current medications, past family history, past medical history, past social history, past surgical history and problem list. Problem list updated.  Objective:   Vitals:   09/23/22 1324  BP: 120/70  Pulse: 86  Temp: (!) 97.1 F (36.2 C)  Weight: 177 lb 3.2 oz (80.4 kg)    Fetal Status: Fetal Heart Rate (bpm): 133 Fundal Height: 36 cm Movement: Present  Presentation: Vertex  General:  Alert, oriented and cooperative. Patient is in no acute distress.  Skin: Skin is warm and dry. No rash noted.   Cardiovascular: Normal heart rate noted  Respiratory: Normal respiratory effort, no problems with respiration noted  Abdomen: Soft, gravid, appropriate for gestational age.  Pain/Pressure: Absent     Pelvic: Cervical exam deferred        Extremities: Normal range of motion.  Edema: None  Mental Status: Normal mood and affect. Normal behavior. Normal judgment and thought content.   Assessment and Plan:  Pregnancy: G4P3003 at [redacted]w[redacted]d  1. Supervision of other normal pregnancy, antepartum Working 40 hrs/wk Not exercising; encouraged to do so 3-4x/wk x 20 min 22 lb 3.2 oz (10.1 kg) 3 lb wt gain in last  2 wks Collected GC/Chlamydia/GBS cultures Knows when to go to L&D Has car seat and crib  - GBS Culture - Chlamydia/GC NAA, Confirmation  2. Anemia affecting pregnancy in second trimester Taking FeSo4 I daily with oj  3. Late prenatal care 15 5/7   4. history macrosomic infant 9#1 01/2019    Preterm labor symptoms and general obstetric precautions including but not limited to vaginal bleeding, contractions, leaking of fluid and fetal movement were reviewed in detail with the patient. Please refer to After Visit Summary for other counseling recommendations.  Return in about 1 week (around 09/30/2022) for routine PNC.  Future Appointments  Date Time Provider Department Center  09/30/2022  1:20 PM [facility] PROVIDER [facility] None    Alberteen Spindle, CNM

## 2022-09-28 DIAGNOSIS — B951 Streptococcus, group B, as the cause of diseases classified elsewhere: Secondary | ICD-10-CM | POA: Insufficient documentation

## 2022-09-29 NOTE — Progress Notes (Signed)
[facility]  PRENATAL VISIT NOTE  Subjective:  Jenna Marks is a 27 y.o. [phone] at [redacted]w[redacted]d being seen today for ongoing prenatal care.  She is currently monitored for the following issues for this low-risk pregnancy and has History gestational hypertension 01/2019 with IOL; Late prenatal care 15 5/7; Supervision of other normal pregnancy, antepartum; history macrosomic infant 9#1 01/2019; Anemia affecting pregnancy in second trimester; Susceptible to varicella (non-immune), currently pregnant; and Positive GBS test on their problem list.  Patient reports {[id number]}.   .  .   . Denies leaking of fluid/ROM.   The following portions of the patient's history were reviewed and updated as appropriate: allergies, current medications, past family history, past medical history, past social history, past surgical history and problem list. Problem list updated.  Objective:  There were no vitals filed for this visit.  Fetal Status:           General:  Alert, oriented and cooperative. Patient is in no acute distress.  Skin: Skin is warm and dry. No rash noted.   Cardiovascular: Normal heart rate noted  Respiratory: Normal respiratory effort, no problems with respiration noted  Abdomen: Soft, gravid, appropriate for gestational age.        Pelvic: Cervical exam deferred        Extremities: Normal range of motion.     Mental Status: Normal mood and affect. Normal behavior. Normal judgment and thought content.   Assessment and Plan:  Pregnancy: G4P3003 at [redacted]w[redacted]d  1. Supervision of other normal pregnancy, antepartum -taking PNV daily? -weight -exercise   2. Anemia affecting pregnancy in second trimester -taking iron daily with juice  3. Positive GBS test -reviewed pos GBS test  4. [redacted] weeks gestation of pregnancy ***   Term labor symptoms and general obstetric precautions including but not limited to vaginal bleeding, contractions, leaking of  fluid and fetal movement were reviewed in detail with the patient. Please refer to After Visit Summary for other counseling recommendations.  No follow-ups on file.  Future Appointments  Date Time Provider Department Center  09/30/2022  1:20 PM [facility] PROVIDER [facility] None    Lenice Llamas, FNP

## 2022-09-30 ENCOUNTER — Ambulatory Visit: Payer: Medicaid Other | Admitting: Family Medicine

## 2022-09-30 VITALS — BP 118/69 | HR 105 | Temp 97.1°F | Wt 178.2 lb

## 2022-09-30 DIAGNOSIS — O99012 Anemia complicating pregnancy, second trimester: Secondary | ICD-10-CM

## 2022-09-30 DIAGNOSIS — Z3483 Encounter for supervision of other normal pregnancy, third trimester: Secondary | ICD-10-CM

## 2022-09-30 DIAGNOSIS — Z3480 Encounter for supervision of other normal pregnancy, unspecified trimester: Secondary | ICD-10-CM

## 2022-09-30 DIAGNOSIS — B951 Streptococcus, group B, as the cause of diseases classified elsewhere: Secondary | ICD-10-CM

## 2022-09-30 DIAGNOSIS — Z3A37 37 weeks gestation of pregnancy: Secondary | ICD-10-CM

## 2022-09-30 DIAGNOSIS — O99013 Anemia complicating pregnancy, third trimester: Secondary | ICD-10-CM

## 2022-09-30 NOTE — Progress Notes (Signed)
Correctly verbalizes how to take iron tablet and PNV. Occasionally takes iron tablet with water if does not have juice. 1 week MHC RV scheduled for 10/07/22 and 10/14/22. Appt reminder card given. Jossie Ng, RN

## 2022-10-06 NOTE — Progress Notes (Signed)
[facility]  PRENATAL VISIT NOTE  Subjective:  Jenna Marks is a 27 y.o. [phone] at [redacted]w[redacted]d being seen today for ongoing prenatal care.  She is currently monitored for the following issues for this low-risk pregnancy and has History gestational hypertension 01/2019 with IOL; Late prenatal care 15 5/7; Supervision of other normal pregnancy, antepartum; history macrosomic infant 9#1 01/2019; Anemia affecting pregnancy in second trimester; Susceptible to varicella (non-immune), currently pregnant; and Positive GBS test on their problem list.  Patient reports {[id number]}.   .  .   . Denies leaking of fluid/ROM.   The following portions of the patient's history were reviewed and updated as appropriate: allergies, current medications, past family history, past medical history, past social history, past surgical history and problem list. Problem list updated.  Objective:  There were no vitals filed for this visit.  Fetal Status:           General:  Alert, oriented and cooperative. Patient is in no acute distress.  Skin: Skin is warm and dry. No rash noted.   Cardiovascular: Normal heart rate noted  Respiratory: Normal respiratory effort, no problems with respiration noted  Abdomen: Soft, gravid, appropriate for gestational age.        Pelvic: Cervical exam deferred        Extremities: Normal range of motion.     Mental Status: Normal mood and affect. Normal behavior. Normal judgment and thought content.   Assessment and Plan:  Pregnancy: G4P3003 at [redacted]w[redacted]d  1. Supervision of other normal pregnancy, antepartum -taking PNV daily? -weight -exercise  2. Anemia affecting pregnancy in second trimester -taking iron daily with juice?   3. [redacted] weeks gestation of pregnancy -IOL papers and cervical exam at RV   Term labor symptoms and general obstetric precautions including but not limited to vaginal bleeding, contractions, leaking of fluid and  fetal movement were reviewed in detail with the patient. Please refer to After Visit Summary for other counseling recommendations.  No follow-ups on file.  Future Appointments  Date Time Provider Department Center  10/07/2022  1:20 PM [facility] PROVIDER [facility] None  10/14/2022  2:20 PM [facility] PROVIDER [facility] None    Lenice Llamas, FNP

## 2022-10-07 ENCOUNTER — Ambulatory Visit: Payer: Medicaid Other | Admitting: Family Medicine

## 2022-10-07 VITALS — BP 125/73 | HR 80 | Temp 97.1°F | Wt 175.8 lb

## 2022-10-07 DIAGNOSIS — O99012 Anemia complicating pregnancy, second trimester: Secondary | ICD-10-CM

## 2022-10-07 DIAGNOSIS — Z3483 Encounter for supervision of other normal pregnancy, third trimester: Secondary | ICD-10-CM

## 2022-10-07 DIAGNOSIS — O99013 Anemia complicating pregnancy, third trimester: Secondary | ICD-10-CM

## 2022-10-07 DIAGNOSIS — Z3A38 38 weeks gestation of pregnancy: Secondary | ICD-10-CM

## 2022-10-07 DIAGNOSIS — Z3480 Encounter for supervision of other normal pregnancy, unspecified trimester: Secondary | ICD-10-CM

## 2022-10-14 ENCOUNTER — Ambulatory Visit: Payer: Medicaid Other | Admitting: Family Medicine

## 2022-10-14 VITALS — BP 123/73 | HR 78 | Temp 97.1°F | Wt 178.4 lb

## 2022-10-14 DIAGNOSIS — Z3A39 39 weeks gestation of pregnancy: Secondary | ICD-10-CM

## 2022-10-14 DIAGNOSIS — O99019 Anemia complicating pregnancy, unspecified trimester: Secondary | ICD-10-CM

## 2022-10-14 DIAGNOSIS — Z3483 Encounter for supervision of other normal pregnancy, third trimester: Secondary | ICD-10-CM

## 2022-10-14 DIAGNOSIS — Z3480 Encounter for supervision of other normal pregnancy, unspecified trimester: Secondary | ICD-10-CM

## 2022-10-14 DIAGNOSIS — O139 Gestational [pregnancy-induced] hypertension without significant proteinuria, unspecified trimester: Secondary | ICD-10-CM

## 2022-10-14 DIAGNOSIS — O99013 Anemia complicating pregnancy, third trimester: Secondary | ICD-10-CM

## 2022-10-14 NOTE — Progress Notes (Signed)
[facility]  PRENATAL VISIT NOTE  Subjective:  Jenna Marks is a 27 y.o. [phone] at [redacted]w[redacted]d being seen today for ongoing prenatal care. She is accompanied by her fiance.  She is currently monitored for the following issues for this low-risk pregnancy and has History gestational hypertension 01/2019 with IOL; Late prenatal care 15 5/7; Supervision of other normal pregnancy, antepartum; history macrosomic infant 9#1 01/2019; Anemia affecting pregnancy in second trimester; Susceptible to varicella (non-immune), currently pregnant; and Positive GBS test on their problem list.  Patient reports no complaints.  Contractions: Not present. Vag. Bleeding: None.  Movement: Present. Denies leaking of fluid/ROM.   The following portions of the patient's history were reviewed and updated as appropriate: allergies, current medications, past family history, past medical history, past social history, past surgical history and problem list. Problem list updated.  Objective:   Vitals:   10/14/22 1403  BP: 123/73  Pulse: 78  Temp: (!) 97.1 F (36.2 C)  Weight: 178 lb 6.4 oz (80.9 kg)    Fetal Status: Fetal Heart Rate (bpm): 135 Fundal Height: 36 cm Movement: Present  Presentation: Vertex  General:  Alert, oriented and cooperative. Patient is in no acute distress.  Skin: Skin is warm and dry. No rash noted.   Cardiovascular: Normal heart rate noted  Respiratory: Normal respiratory effort, no problems with respiration noted  Abdomen: Soft, gravid, appropriate for gestational age.  Pain/Pressure: Absent     Pelvic: Cervical exam performed Dilation: 2 Effacement (%): 40 Station: -1  Extremities: Normal range of motion.  Edema: None  Mental Status: Normal mood and affect. Normal behavior. Normal judgment and thought content.   Assessment and Plan:  Pregnancy: G4P3003 at [redacted]w[redacted]d  1. Supervision of other normal pregnancy, antepartum 23 lb 6.4 oz (10.6  kg) -weight gain from last week is 3lbs -IOL paper filled out and given to RN. Pt aware.  2. [redacted] weeks gestation of pregnancy -Membrane sweep discussed including pros (may induce labor w/i 48hrs) and cons (may accidentally break amniotic sac, may be uncomfortable, may not induce labor). Membrane sweep is optional and can be stopped at any time. -Pt desires membrane sweep -Membrane sweep performed -Discussed that she may opt for another membrane sweep next week if she desires.   3. Anemia affecting pregnancy, antepartum Last hgb 5/22 = 11 -Taking daily iron  4. H/o gestational hypertension, antepartum -BP WNL today   Term labor symptoms and general obstetric precautions including but not limited to vaginal bleeding, contractions, leaking of fluid and fetal movement were reviewed in detail with the patient. Please refer to After Visit Summary for other counseling recommendations.  Return in about 1 week (around 10/21/2022) for Routine PNC.  Future Appointments  Date Time Provider Department Center  10/21/2022  1:40 PM [facility] PROVIDER [facility] None     Alicia Amel, NP

## 2022-10-14 NOTE — Progress Notes (Signed)
Here today for 39.0 week MH RV. Taking PNV and iron every day. Denies ED/hospital visits since last RV. Cervical check and IOL form today. Tawny Hopping, RN

## 2022-10-14 NOTE — Progress Notes (Signed)

## 2022-10-14 NOTE — Progress Notes (Signed)
IOL form and info faxed to [facility]. Tawny Hopping, RN

## 2022-10-15 ENCOUNTER — Encounter: Payer: Self-pay | Admitting: Obstetrics and Gynecology

## 2022-10-15 ENCOUNTER — Telehealth: Payer: Self-pay | Admitting: Family Medicine

## 2022-10-15 ENCOUNTER — Other Ambulatory Visit: Payer: Self-pay | Admitting: Obstetrics and Gynecology

## 2022-10-15 DIAGNOSIS — Z3490 Encounter for supervision of normal pregnancy, unspecified, unspecified trimester: Secondary | ICD-10-CM

## 2022-10-15 NOTE — Telephone Encounter (Signed)
Pt wants to change her induction day, since that would be her son's 1st day of school. Please call her back to see how can we accommodate her. Thanks :)

## 2022-10-15 NOTE — Telephone Encounter (Addendum)
TC to patient to ask what her requested IOL date would be. Patient states she would like to be induced next week or on a weekend before 10/28/22. Patient counseled that local practices may not agree to induce before 41 weeks. Patient states she "can't keep calling out of work". When asked why and how often she is calling out of work, the line was disconnected.  TC to patient following disconnection, and patient is requesting IOL 10/24/22 due to transportation issues and her son starting school. Patient will be 40 3/7 on 10/24/22.  Patient counseled that we can try to ask for her to scheduled earlier, but that it is up to the delivering provider and the hospital. Will talk with provider tomorrow. Burt Knack, RN   Per patient sticky note, IOL is already scheduled for 10/27/22 at 0500. Will call [facility] tomorrow to verify.Burt Knack, RN

## 2022-10-16 NOTE — Telephone Encounter (Signed)
TC to patient to inform her of IOL date of 10/27/22 at 0500. Patient states that is fine and denies questions, phone call cut off.Burt Knack, RN

## 2022-10-21 ENCOUNTER — Ambulatory Visit: Payer: Medicaid Other | Admitting: Family Medicine

## 2022-10-21 VITALS — BP 118/76 | HR 80 | Temp 97.6°F | Wt 176.2 lb

## 2022-10-21 DIAGNOSIS — Z3483 Encounter for supervision of other normal pregnancy, third trimester: Secondary | ICD-10-CM

## 2022-10-21 DIAGNOSIS — O99013 Anemia complicating pregnancy, third trimester: Secondary | ICD-10-CM

## 2022-10-21 DIAGNOSIS — O99012 Anemia complicating pregnancy, second trimester: Secondary | ICD-10-CM

## 2022-10-21 DIAGNOSIS — Z3A40 40 weeks gestation of pregnancy: Secondary | ICD-10-CM

## 2022-10-21 DIAGNOSIS — Z3480 Encounter for supervision of other normal pregnancy, unspecified trimester: Secondary | ICD-10-CM

## 2022-10-21 NOTE — Progress Notes (Signed)
[facility]  PRENATAL VISIT NOTE  Subjective:  Jenna Marks is a 27 y.o. [phone] at [redacted]w[redacted]d being seen today for ongoing prenatal care.  She is currently monitored for the following issues for this low-risk pregnancy and has History gestational hypertension 01/2019 with IOL; Late prenatal care 15 5/7; Supervision of other normal pregnancy, antepartum; history macrosomic infant 9#1 01/2019; Anemia affecting pregnancy in second trimester; Susceptible to varicella (non-immune), currently pregnant; and Positive GBS test on their problem list.  Patient reports no complaints.  Contractions: Not present. Vag. Bleeding: None.  Movement: Present. Denies leaking of fluid/ROM.   The following portions of the patient's history were reviewed and updated as appropriate: allergies, current medications, past family history, past medical history, past social history, past surgical history and problem list. Problem list updated.  Objective:   Vitals:   10/21/22 1340  BP: 118/76  Pulse: 80  Temp: 97.6 F (36.4 C)  Weight: 176 lb 3.2 oz (79.9 kg)    Fetal Status: Fetal Heart Rate (bpm): 138 Fundal Height: 40 cm Movement: Present  Presentation: Vertex  General:  Alert, oriented and cooperative. Patient is in no acute distress.  Skin: Skin is warm and dry. No rash noted.   Cardiovascular: Normal heart rate noted  Respiratory: Normal respiratory effort, no problems with respiration noted  Abdomen: Soft, gravid, appropriate for gestational age.  Pain/Pressure: Present     Pelvic: Cervical exam performed Dilation: 3.5 Effacement (%): 50 Station: -2  Extremities: Normal range of motion.  Edema: None  Mental Status: Normal mood and affect. Normal behavior. Normal judgment and thought content.   Assessment and Plan:  Pregnancy: G4P3003 at [redacted]w[redacted]d  1. Supervision of other normal pregnancy, antepartum -taking PNV daily 21 lb 3.2 oz (9.616 kg)  2. Anemia  affecting pregnancy in second trimester Taking iron with juice  3. [redacted] weeks gestation of pregnancy -cervical exam today and membrane sweep   Preterm labor symptoms and general obstetric precautions including but not limited to vaginal bleeding, contractions, leaking of fluid and fetal movement were reviewed in detail with the patient. Please refer to After Visit Summary for other counseling recommendations.  No follow-ups on file.  No future appointments.  Lenice Llamas, [place]

## 2022-10-21 NOTE — Progress Notes (Signed)
Aware of IOL time and date. BTHIELE RN

## 2022-10-26 ENCOUNTER — Inpatient Hospital Stay: Payer: Medicaid Other | Admitting: Certified Registered"

## 2022-10-26 ENCOUNTER — Other Ambulatory Visit: Payer: Self-pay

## 2022-10-26 ENCOUNTER — Inpatient Hospital Stay
Admission: EM | Admit: 2022-10-26 | Discharge: 2022-10-27 | DRG: 798 | Disposition: A | Discharge status: Home / Self Care | Payer: Medicaid Other | Attending: Certified Nurse Midwife | Admitting: Certified Nurse Midwife

## 2022-10-26 ENCOUNTER — Encounter
Admission: EM | Disposition: A | Discharge status: Home / Self Care | Payer: Self-pay | Source: Home / Self Care | Attending: Certified Nurse Midwife

## 2022-10-26 ENCOUNTER — Encounter: Payer: Self-pay | Admitting: Obstetrics and Gynecology

## 2022-10-26 DIAGNOSIS — B951 Streptococcus, group B, as the cause of diseases classified elsewhere: Secondary | ICD-10-CM

## 2022-10-26 DIAGNOSIS — O9903 Anemia complicating the puerperium: Secondary | ICD-10-CM | POA: Diagnosis not present

## 2022-10-26 DIAGNOSIS — O99824 Streptococcus B carrier state complicating childbirth: Secondary | ICD-10-CM | POA: Diagnosis present

## 2022-10-26 DIAGNOSIS — O99820 Streptococcus B carrier state complicating pregnancy: Secondary | ICD-10-CM | POA: Diagnosis not present

## 2022-10-26 DIAGNOSIS — Z3A40 40 weeks gestation of pregnancy: Secondary | ICD-10-CM | POA: Diagnosis not present

## 2022-10-26 DIAGNOSIS — O480 Post-term pregnancy: Principal | ICD-10-CM | POA: Diagnosis present

## 2022-10-26 DIAGNOSIS — Z3483 Encounter for supervision of other normal pregnancy, third trimester: Secondary | ICD-10-CM | POA: Diagnosis not present

## 2022-10-26 DIAGNOSIS — Z302 Encounter for sterilization: Secondary | ICD-10-CM

## 2022-10-26 DIAGNOSIS — D62 Acute posthemorrhagic anemia: Secondary | ICD-10-CM

## 2022-10-26 DIAGNOSIS — Z2839 Other underimmunization status: Secondary | ICD-10-CM

## 2022-10-26 DIAGNOSIS — Z3490 Encounter for supervision of normal pregnancy, unspecified, unspecified trimester: Secondary | ICD-10-CM

## 2022-10-26 DIAGNOSIS — O99012 Anemia complicating pregnancy, second trimester: Principal | ICD-10-CM

## 2022-10-26 HISTORY — PX: TUBAL LIGATION: SHX77

## 2022-10-26 LAB — CBC
HCT: 36.3 % (ref 36.0–46.0)
Hemoglobin: 12 g/dL (ref 12.0–15.0)
MCH: 28.2 pg (ref 26.0–34.0)
MCHC: 33.1 g/dL (ref 30.0–36.0)
MCV: 85.4 fL (ref 80.0–100.0)
Platelets: 231 10*3/uL (ref 150–400)
RBC: 4.25 MIL/uL (ref 3.87–5.11)
RDW: 15.3 % (ref 11.5–15.5)
WBC: 13 10*3/uL — ABNORMAL HIGH (ref 4.0–10.5)
nRBC: 0 % (ref 0.0–0.2)

## 2022-10-26 LAB — RPR: RPR Ser Ql: NONREACTIVE

## 2022-10-26 LAB — TYPE AND SCREEN
ABO/RH(D): O POS
Antibody Screen: NEGATIVE

## 2022-10-26 SURGERY — LIGATION, FALLOPIAN TUBE, POSTPARTUM
Anesthesia: General | Laterality: Bilateral

## 2022-10-26 MED ORDER — TETANUS-DIPHTH-ACELL PERTUSSIS 5-2.5-18.5 LF-MCG/0.5 IM SUSY: 0.5000 mL | PREFILLED_SYRINGE | Freq: Once | INTRAMUSCULAR | Status: DC

## 2022-10-26 MED ORDER — COCONUT OIL OIL: 1.0000 | TOPICAL_OIL | Status: DC | PRN

## 2022-10-26 MED ORDER — OXYCODONE-ACETAMINOPHEN 5-325 MG PO TABS: 1.0000 | ORAL_TABLET | ORAL | Status: DC | PRN

## 2022-10-26 MED ORDER — SUGAMMADEX SODIUM 200 MG/2ML IV SOLN
INTRAVENOUS | Status: DC | PRN
  Administered 2022-10-26: 200 mg via INTRAVENOUS

## 2022-10-26 MED ORDER — AMMONIA AROMATIC IN INHA
RESPIRATORY_TRACT | Status: AC
  Filled 2022-10-26: qty 10

## 2022-10-26 MED ORDER — FENTANYL CITRATE (PF) 100 MCG/2ML IJ SOLN
INTRAMUSCULAR | Status: AC
  Filled 2022-10-26: qty 2

## 2022-10-26 MED ORDER — DEXTROSE IN LACTATED RINGERS 5 % IV SOLN: INTRAVENOUS | Status: DC

## 2022-10-26 MED ORDER — FENTANYL CITRATE (PF) 100 MCG/2ML IJ SOLN
INTRAMUSCULAR | Status: DC | PRN
  Administered 2022-10-26 (×4): 50 ug via INTRAVENOUS

## 2022-10-26 MED ORDER — MISOPROSTOL 200 MCG PO TABS
ORAL_TABLET | ORAL | Status: AC
  Filled 2022-10-26: qty 4

## 2022-10-26 MED ORDER — DROPERIDOL 2.5 MG/ML IJ SOLN: 0.6250 mg | Freq: Once | INTRAMUSCULAR | Status: DC | PRN

## 2022-10-26 MED ORDER — KETOROLAC TROMETHAMINE 30 MG/ML IJ SOLN: 30.0000 mg | Freq: Once | INTRAMUSCULAR | Status: DC

## 2022-10-26 MED ORDER — FENTANYL CITRATE (PF) 100 MCG/2ML IJ SOLN
25.0000 ug | INTRAMUSCULAR | Status: DC | PRN
  Administered 2022-10-26: 25 ug via INTRAVENOUS
  Administered 2022-10-26: 50 ug via INTRAVENOUS
  Administered 2022-10-26: 25 ug via INTRAVENOUS

## 2022-10-26 MED ORDER — ACETAMINOPHEN 325 MG PO TABS
650.0000 mg | ORAL_TABLET | ORAL | Status: DC | PRN
  Administered 2022-10-26: 650 mg via ORAL
  Filled 2022-10-26: qty 2

## 2022-10-26 MED ORDER — OXYTOCIN-SODIUM CHLORIDE 30-0.9 UT/500ML-% IV SOLN
INTRAVENOUS | Status: AC
  Filled 2022-10-26: qty 500

## 2022-10-26 MED ORDER — OXYCODONE-ACETAMINOPHEN 5-325 MG PO TABS: 2.0000 | ORAL_TABLET | ORAL | Status: DC | PRN

## 2022-10-26 MED ORDER — SOD CITRATE-CITRIC ACID 500-334 MG/5ML PO SOLN: 30.0000 mL | ORAL | Status: DC | PRN

## 2022-10-26 MED ORDER — PROPOFOL 10 MG/ML IV BOLUS
INTRAVENOUS | Status: AC
  Filled 2022-10-26: qty 20

## 2022-10-26 MED ORDER — FERROUS SULFATE 325 (65 FE) MG PO TABS
325.0000 mg | ORAL_TABLET | Freq: Every day | ORAL | Status: DC
  Administered 2022-10-27: 325 mg via ORAL
  Filled 2022-10-26: qty 1

## 2022-10-26 MED ORDER — BENZOCAINE-MENTHOL 20-0.5 % EX AERO: 1.0000 | INHALATION_SPRAY | CUTANEOUS | Status: DC | PRN

## 2022-10-26 MED ORDER — TERBUTALINE SULFATE 1 MG/ML IJ SOLN: 0.2500 mg | Freq: Once | INTRAMUSCULAR | Status: DC | PRN

## 2022-10-26 MED ORDER — WITCH HAZEL-GLYCERIN EX PADS: 1.0000 | MEDICATED_PAD | CUTANEOUS | Status: DC | PRN

## 2022-10-26 MED ORDER — FENTANYL CITRATE (PF) 100 MCG/2ML IJ SOLN: 50.0000 ug | INTRAMUSCULAR | Status: DC | PRN

## 2022-10-26 MED ORDER — OXYTOCIN 10 UNIT/ML IJ SOLN
INTRAMUSCULAR | Status: AC
  Filled 2022-10-26: qty 2

## 2022-10-26 MED ORDER — ACETAMINOPHEN 325 MG PO TABS: 650.0000 mg | ORAL_TABLET | ORAL | Status: DC | PRN

## 2022-10-26 MED ORDER — SIMETHICONE 80 MG PO CHEW
80.0000 mg | CHEWABLE_TABLET | ORAL | Status: DC | PRN
  Administered 2022-10-26: 80 mg via ORAL
  Filled 2022-10-26: qty 1

## 2022-10-26 MED ORDER — BUPIVACAINE HCL 0.5 % IJ SOLN
INTRAMUSCULAR | Status: DC | PRN
  Administered 2022-10-26: 10 mL

## 2022-10-26 MED ORDER — ONDANSETRON HCL 4 MG/2ML IJ SOLN: 4.0000 mg | Freq: Four times a day (QID) | INTRAMUSCULAR | Status: DC | PRN

## 2022-10-26 MED ORDER — DEXAMETHASONE SODIUM PHOSPHATE 10 MG/ML IJ SOLN
INTRAMUSCULAR | Status: DC | PRN
  Administered 2022-10-26: 5 mg via INTRAVENOUS

## 2022-10-26 MED ORDER — ONDANSETRON 4 MG PO TBDP: 4.0000 mg | ORAL_TABLET | Freq: Four times a day (QID) | ORAL | Status: DC | PRN

## 2022-10-26 MED ORDER — LIDOCAINE HCL (CARDIAC) PF 100 MG/5ML IV SOSY
PREFILLED_SYRINGE | INTRAVENOUS | Status: DC | PRN
  Administered 2022-10-26: 60 mg via INTRAVENOUS

## 2022-10-26 MED ORDER — LACTATED RINGERS IV SOLN: 500.0000 mL | INTRAVENOUS | Status: DC | PRN

## 2022-10-26 MED ORDER — IBUPROFEN 600 MG PO TABS
600.0000 mg | ORAL_TABLET | Freq: Four times a day (QID) | ORAL | Status: DC
  Administered 2022-10-26 – 2022-10-27 (×5): 600 mg via ORAL
  Filled 2022-10-26 (×5): qty 1

## 2022-10-26 MED ORDER — PRENATAL MULTIVITAMIN CH
1.0000 | ORAL_TABLET | Freq: Every day | ORAL | Status: DC
  Administered 2022-10-26 – 2022-10-27 (×2): 1 via ORAL
  Filled 2022-10-26 (×2): qty 1

## 2022-10-26 MED ORDER — LACTATED RINGERS IV SOLN: INTRAVENOUS | Status: DC | PRN

## 2022-10-26 MED ORDER — MISOPROSTOL 50MCG HALF TABLET: 50.0000 ug | ORAL_TABLET | ORAL | Status: DC | PRN

## 2022-10-26 MED ORDER — SUCCINYLCHOLINE CHLORIDE 200 MG/10ML IV SOSY
PREFILLED_SYRINGE | INTRAVENOUS | Status: DC | PRN
  Administered 2022-10-26: 140 mg via INTRAVENOUS

## 2022-10-26 MED ORDER — DIBUCAINE (PERIANAL) 1 % EX OINT: 1.0000 | TOPICAL_OINTMENT | CUTANEOUS | Status: DC | PRN

## 2022-10-26 MED ORDER — ONDANSETRON HCL 4 MG PO TABS: 4.0000 mg | ORAL_TABLET | ORAL | Status: DC | PRN

## 2022-10-26 MED ORDER — ONDANSETRON HCL 4 MG/2ML IJ SOLN
INTRAMUSCULAR | Status: DC | PRN
  Administered 2022-10-26: 4 mg via INTRAVENOUS

## 2022-10-26 MED ORDER — LACTATED RINGERS IV BOLUS: 1000.0000 mL | Freq: Once | INTRAVENOUS | Status: DC

## 2022-10-26 MED ORDER — LIDOCAINE HCL (PF) 1 % IJ SOLN
INTRAMUSCULAR | Status: AC
  Filled 2022-10-26: qty 30

## 2022-10-26 MED ORDER — OXYTOCIN-SODIUM CHLORIDE 30-0.9 UT/500ML-% IV SOLN: 2.5000 [IU]/h | INTRAVENOUS | Status: DC

## 2022-10-26 MED ORDER — ONDANSETRON HCL 4 MG/2ML IJ SOLN
INTRAMUSCULAR | Status: AC
  Filled 2022-10-26: qty 2

## 2022-10-26 MED ORDER — OXYTOCIN BOLUS FROM INFUSION
333.0000 mL | Freq: Once | INTRAVENOUS | Status: AC
  Administered 2022-10-26: 333 mL via INTRAVENOUS

## 2022-10-26 MED ORDER — SODIUM CHLORIDE 0.9 % IV SOLN: 1.0000 g | INTRAVENOUS | Status: DC

## 2022-10-26 MED ORDER — PROPOFOL 10 MG/ML IV BOLUS
INTRAVENOUS | Status: DC | PRN
  Administered 2022-10-26: 150 mg via INTRAVENOUS

## 2022-10-26 MED ORDER — ONDANSETRON HCL 4 MG/2ML IJ SOLN: 4.0000 mg | INTRAMUSCULAR | Status: DC | PRN

## 2022-10-26 MED ORDER — SENNOSIDES-DOCUSATE SODIUM 8.6-50 MG PO TABS
2.0000 | ORAL_TABLET | ORAL | Status: DC
  Administered 2022-10-26 – 2022-10-27 (×2): 2 via ORAL
  Filled 2022-10-26 (×2): qty 2

## 2022-10-26 MED ORDER — BUPIVACAINE HCL (PF) 0.5 % IJ SOLN
INTRAMUSCULAR | Status: AC
  Filled 2022-10-26: qty 30

## 2022-10-26 MED ORDER — SODIUM CHLORIDE 0.9 % IV SOLN
2.0000 g | Freq: Once | INTRAVENOUS | Status: AC
  Administered 2022-10-26: 2 g via INTRAVENOUS
  Filled 2022-10-26: qty 2000

## 2022-10-26 MED ORDER — DEXAMETHASONE SODIUM PHOSPHATE 10 MG/ML IJ SOLN
INTRAMUSCULAR | Status: AC
  Filled 2022-10-26: qty 1

## 2022-10-26 MED ORDER — METHYLERGONOVINE MALEATE 0.2 MG PO TABS: 0.2000 mg | ORAL_TABLET | ORAL | Status: DC | PRN

## 2022-10-26 MED ORDER — LIDOCAINE HCL (PF) 1 % IJ SOLN: 30.0000 mL | INTRAMUSCULAR | Status: DC | PRN

## 2022-10-26 MED ORDER — LACTATED RINGERS IV SOLN
INTRAVENOUS | Status: DC
  Administered 2022-10-26: 1000 mL via INTRAVENOUS

## 2022-10-26 MED ORDER — OXYCODONE HCL 5 MG PO TABS: 5.0000 mg | ORAL_TABLET | ORAL | Status: DC | PRN

## 2022-10-26 MED ORDER — METHYLERGONOVINE MALEATE 0.2 MG/ML IJ SOLN: 0.2000 mg | INTRAMUSCULAR | Status: DC | PRN

## 2022-10-26 MED ORDER — ROCURONIUM BROMIDE 100 MG/10ML IV SOLN
INTRAVENOUS | Status: DC | PRN
  Administered 2022-10-26: 10 mg via INTRAVENOUS
  Administered 2022-10-26: 30 mg via INTRAVENOUS

## 2022-10-26 MED ORDER — DOCUSATE SODIUM 100 MG PO CAPS
100.0000 mg | ORAL_CAPSULE | Freq: Two times a day (BID) | ORAL | Status: DC
  Administered 2022-10-27: 100 mg via ORAL
  Filled 2022-10-26: qty 1

## 2022-10-26 SURGICAL SUPPLY — 35 items
APL PRP STRL LF DISP 70% ISPRP (MISCELLANEOUS) ×1
APL SKNCLS STERI-STRIP NONHPOA (GAUZE/BANDAGES/DRESSINGS) ×1
BENZOIN TINCTURE PRP APPL 2/3 (GAUZE/BANDAGES/DRESSINGS) ×1 IMPLANT
BLADE SURG 15 STRL LF DISP TIS (BLADE) ×1 IMPLANT
BLADE SURG 15 STRL SS (BLADE) ×1
BNDG ADH 2 X3.75 FABRIC TAN LF (GAUZE/BANDAGES/DRESSINGS) ×1 IMPLANT
BNDG ADH XL 3.75X2 STRCH LF (GAUZE/BANDAGES/DRESSINGS) ×1
CHLORAPREP W/TINT 26 (MISCELLANEOUS) ×1 IMPLANT
CLIP FILSHIE TUBAL LIGA STRL (Clip) ×1 IMPLANT
DRAPE LAPAROTOMY 77X122 PED (DRAPES) ×1 IMPLANT
DRSG TEGADERM 2-3/8X2-3/4 SM (GAUZE/BANDAGES/DRESSINGS) ×1 IMPLANT
DRSG TELFA 3X4 N-ADH STERILE (GAUZE/BANDAGES/DRESSINGS) ×1 IMPLANT
GAUZE 4X4 16PLY ~~LOC~~+RFID DBL (SPONGE) ×1 IMPLANT
GLOVE PI ORTHO PRO STRL 7.5 (GLOVE) ×1 IMPLANT
GOWN STRL REUS W/ TWL LRG LVL3 (GOWN DISPOSABLE) ×1 IMPLANT
GOWN STRL REUS W/ TWL XL LVL3 (GOWN DISPOSABLE) ×1 IMPLANT
GOWN STRL REUS W/TWL LRG LVL3 (GOWN DISPOSABLE) ×1
GOWN STRL REUS W/TWL XL LVL3 (GOWN DISPOSABLE) ×1
KIT TURNOVER CYSTO (KITS) ×1 IMPLANT
LABEL OR SOLS (LABEL) ×1 IMPLANT
MANIFOLD NEPTUNE II (INSTRUMENTS) ×1 IMPLANT
NDL HYPO 25GX1X1/2 BEV (NEEDLE) ×1 IMPLANT
NEEDLE HYPO 25GX1X1/2 BEV (NEEDLE) ×1 IMPLANT
NS IRRIG 500ML POUR BTL (IV SOLUTION) ×1 IMPLANT
PACK BASIN MINOR ARMC (MISCELLANEOUS) ×1 IMPLANT
RETRACTOR RING XSMALL (MISCELLANEOUS) ×1 IMPLANT
RTRCTR WOUND ALEXIS 13CM XS SH (MISCELLANEOUS) ×1
SCRUB CHG 4% DYNA-HEX 4OZ (MISCELLANEOUS) ×1 IMPLANT
STRIP CLOSURE SKIN 1/2X4 (GAUZE/BANDAGES/DRESSINGS) ×1 IMPLANT
SUT VIC AB 4-0 PS2 18 (SUTURE) ×1 IMPLANT
SUT VICRYL 0 UR6 27IN ABS (SUTURE) ×2 IMPLANT
SYR 10ML LL (SYRINGE) ×1 IMPLANT
TAPE SURG TRANSPORE 1 IN (GAUZE/BANDAGES/DRESSINGS) ×1 IMPLANT
TRAP FLUID SMOKE EVACUATOR (MISCELLANEOUS) ×1 IMPLANT
WATER STERILE IRR 500ML POUR (IV SOLUTION) ×1 IMPLANT

## 2022-10-26 NOTE — Interval H&P Note (Signed)
History and Physical Interval Note:  10/26/2022 11:07 AM  Jenna Marks  has presented today for surgery, with the diagnosis of Patient request sterilzation..  The various methods of treatment have been discussed with the patient and family. After consideration of risks, benefits and other options for treatment, the patient has consented to  Procedure(s): POST PARTUM TUBAL LIGATION (Bilateral) as a surgical intervention.  The patient's history has been reviewed, patient examined, no change in status, stable for surgery.  I have reviewed the patient's chart and labs.  Questions were answered to the patient's satisfaction.     Brennan Bailey

## 2022-10-26 NOTE — Transfer of Care (Signed)
Immediate Anesthesia Transfer of Care Note  Patient: Jenna Marks  Procedure(s) Performed: POST PARTUM TUBAL LIGATION (Bilateral)  Patient Location: PACU  Anesthesia Type:General  Level of Consciousness: awake and drowsy  Airway & Oxygen Therapy: Patient Spontanous Breathing and Patient connected to nasal cannula oxygen  Post-op Assessment: Report given to RN and Post -op Vital signs reviewed and stable  Post vital signs: Reviewed and stable  Last Vitals:  Vitals Value Taken Time  BP 113/69 10/26/22 1208  Temp    Pulse 98 10/26/22 1210  Resp 15 10/26/22 1210  SpO2 100 % 10/26/22 1210  Vitals shown include unfiled device data.  Last Pain:  Vitals:   10/26/22 1031  TempSrc:   PainSc: 2       Patients Stated Pain Goal: 0 (10/26/22 0552)  Complications: No notable events documented.

## 2022-10-26 NOTE — Anesthesia Procedure Notes (Signed)
Procedure Name: Intubation Date/Time: 10/26/2022 11:20 AM  Performed by: Monico Hoar, CRNAPre-anesthesia Checklist: Patient identified, Patient being monitored, Timeout performed, Emergency Drugs available and Suction available Patient Re-evaluated:Patient Re-evaluated prior to induction Oxygen Delivery Method: Circle system utilized Preoxygenation: Pre-oxygenation with 100% oxygen Induction Type: IV induction Ventilation: Mask ventilation without difficulty Laryngoscope Size: Mac and 3 Grade View: Grade I Tube type: Oral Tube size: 6.5 mm Number of attempts: 1 Airway Equipment and Method: Stylet Placement Confirmation: ETT inserted through vocal cords under direct vision, positive ETCO2 and breath sounds checked- equal and bilateral Secured at: 21 cm Tube secured with: Tape Dental Injury: Teeth and Oropharynx as per pre-operative assessment

## 2022-10-26 NOTE — Discharge Summary (Signed)
Postpartum Discharge Summary  Date of Service updated***     Patient Name: Jenna Marks DOB: October 15, 1995 MRN: [id number]  Date of admission: 10/26/2022 Delivery date:10/26/2022 Delivering provider: Doreene Burke Date of discharge: 10/26/2022  Admitting diagnosis: Post-dates pregnancy [O48.0] Intrauterine pregnancy: [redacted]w[redacted]d     Secondary diagnosis:  Principal Problem:   Post-dates pregnancy  Additional problems: GBS positive    Discharge diagnosis: Term Pregnancy Delivered                                              Post partum procedures:{Postpartum procedures:23558} Augmentation: N/A Complications: None  Hospital course: Onset of Labor With Vaginal Delivery      27 y.o. yo [phone] at [redacted]w[redacted]d was admitted in Active Labor on 10/26/2022. Labor course was uncomplicated   Membrane Rupture Time/Date: 7:51 AM,10/26/2022  Delivery Method:Vaginal, Spontaneous Operative Delivery:N/A Episiotomy: None Lacerations:  None Patient had a postpartum course complicated by ***.  She is ambulating, tolerating a regular diet, passing flatus, and urinating well. Patient is discharged home in stable condition on 10/26/22.  Newborn Data: Birth date:10/26/2022 Birth time:7:52 AM Gender:Female Living status:Living Apgars: ,  Weight:   Magnesium Sulfate received: No BMZ received: No Rhophylac:No MMR:N/A T-DaP:Given prenatally Flu: N/A Transfusion:{Transfusion received:30440034}  Physical exam  Vitals:   10/26/22 0551 10/26/22 0556 10/26/22 0804 10/26/22 0819  BP:  123/71 122/75 130/84  Pulse:  88 77 80  Resp:  18 20   Temp:  98.5 F (36.9 C) (!) 97.5 F (36.4 C)   TempSrc:  Oral Oral   Weight: 79.9 kg     Height: 5\' 4"  (1.626 m)      General: {Exam; general:21111117} Lochia: {Desc; appropriate/inappropriate:30686::"appropriate"} Uterine Fundus: {Desc; firm/soft:30687} Incision: {Exam; incision:21111123} DVT Evaluation: {Exam; QIO:9629528} Labs: Lab Results  Component  Value Date   WBC 13.0 (H) 10/26/2022   HGB 12.0 10/26/2022   HCT 36.3 10/26/2022   MCV 85.4 10/26/2022   PLT 231 10/26/2022      Latest Ref Rng & Units 03/01/2019   10:27 AM  CMP  Glucose 70 - 99 mg/dL 413   BUN 6 - 20 mg/dL 10   Creatinine 2.44 - 1.00 mg/dL 0.10   Sodium 272 - 536 mmol/L 137   Potassium 3.5 - 5.1 mmol/L 3.6   Chloride 98 - 111 mmol/L 108   CO2 22 - 32 mmol/L 19   Calcium 8.9 - 10.3 mg/dL 8.7   Total Protein 6.5 - 8.1 g/dL 6.5   Total Bilirubin 0.3 - 1.2 mg/dL 0.3   Alkaline Phos 38 - 126 U/L 203   AST 15 - 41 U/L 16   ALT 0 - 44 U/L 12    Edinburgh Score:    03/02/2019    1:21 PM  Edinburgh Postnatal Depression Scale Screening Tool  I have been able to laugh and see the funny side of things. 0  I have looked forward with enjoyment to things. 0  I have blamed myself unnecessarily when things went wrong. 1  I have been anxious or worried for no good reason. 1  I have felt scared or panicky for no good reason. 0  Things have been getting on top of me. 0  I have been so unhappy that I have had difficulty sleeping. 0  I have felt sad or miserable. 0  I have been so unhappy that  I have been crying. 0  The thought of harming myself has occurred to me. 0  Edinburgh Postnatal Depression Scale Total 2      After visit meds:  Allergies as of 10/26/2022   No Known Allergies   Med Rec must be completed prior to using this [facility]***        Discharge home in stable condition Infant Feeding: {Baby feeding:23562} Infant Disposition:{CHL IP OB HOME WITH ZOXWRU:04540} Discharge instruction: per After Visit Summary and Postpartum booklet. Activity: Advance as tolerated. Pelvic rest for 6 weeks.  Diet: routine diet Anticipated Birth Control: {Birth Control:23956} Postpartum Appointment:{Outpatient follow up:23559} Additional Postpartum F/U: {PP Procedure:23957} Future Appointments: Future Appointments  Date Time Provider Department Center  10/28/2022   1:20 PM [facility] PROVIDER [facility] None   Follow up Visit:      10/26/2022 Doreene Burke, CNM

## 2022-10-26 NOTE — Op Note (Signed)
     OPERATIVE NOTE 10/26/2022 11:58 AM  PRE-OPERATIVE DIAGNOSIS:  1) Patient request sterilzation.  POST-OPERATIVE DIAGNOSIS:  1) Same  OPERATION:   POST PARTUM TUBAL LIGATION: For sterilization with Filshie clips  SURGEON(S): Surgeons and Role:    Linzie Collin, MD - Primary   ANESTHESIA: Choice  ESTIMATED BLOOD LOSS: 3mL  SPECIMEN: * No specimens in log *  COMPLICATIONS: None  DISPOSITION: Stable to recovery room  DESCRIPTION OF PROCEDURE:      The patient was prepped and draped in the supine position and placed under general anesthesia. The bladder was emptied.  A small infraumbilical incision was made and we carefully dissected our way through the fascia and peritoneum with blunt and sharp dissection until we were within the abdominal cavity. Small right-angle retractors were placed. The right fallopian tube was identified and followed out to its fine fimbriated end and then back approximate half the way. At this point the section of tube was elevated on a Babcock clamp and Filshie clip was used to completely occlude the tube in a perpendicular manner. The left fallopian tube was similarly identified and again the midportion of the tube is completely occluded using a Filshie clip in a perpendicular manner. Hemostasis of the fallopian tubes was noted. The incision was closed with a deep suture through the fascia of 0 Vicryl followed by subcuticular closure of the skin. A long-acting anesthetic was injected.  Dermabond was applied.  The patient went to the recovery room in stable condition.   Elonda Husky, M.D. 10/26/2022 11:58 AM

## 2022-10-26 NOTE — H&P (Signed)
History and Physical   HPI  Jenna Marks is a 27 y.o. L8V5643 at [redacted]w[redacted]d Estimated Date of Delivery: 10/21/22 who is being admitted for labor management.    OB History  OB History  Gravida Para Term Preterm AB Living  4 3 3  0 0 3  SAB IAB Ectopic Multiple Live Births  0 0 0 0 3    # Outcome Date GA Lbr Len/2nd Weight Sex Type Anes PTL Lv  4 Current           3 Term 10/03/20   3345 g M Vag-Spont   LIV  2 Term 03/02/19 [redacted]w[redacted]d 04:10 / 00:15 4120 g F Vag-Spont EPI  LIV     Name: Jenna Marks     Apgar1: 7  Apgar5: 9  1 Term 05/30/17 [redacted]w[redacted]d 12:48 / 00:37 3289 g M Vag-Spont None  LIV     Name: Jenna Marks     Apgar1: 8  Apgar5: 9    PROBLEM LIST  Pregnancy complications or risks: Patient Active Problem List   Diagnosis Date Noted   Positive GBS test 09/28/2022   Susceptible to varicella (non-immune), currently pregnant 05/11/2022   Late prenatal care 15 5/7 05/04/2022   Supervision of other normal pregnancy, antepartum 05/04/2022   history macrosomic infant 9#1 01/2019 05/04/2022   Anemia affecting pregnancy in second trimester 05/04/2022   History gestational hypertension 01/2019 with IOL 03/02/2019    Prenatal labs and studies: ABO, Rh: O/Positive/-- (03/04 1549) Antibody: Negative (03/04 1549) Rubella: 1.17 (03/04 1549) RPR: Non Reactive (05/22 1035)  HBsAg: Negative (03/04 1549)  HIV: Non Reactive (03/04 1549)  PIR:JJOACZYS/-- (07/24 1433)   Past Medical History:  Diagnosis Date   Anemia affecting pregnancy in second trimester 05/04/2022   Pregnancy induced hypertension      History reviewed. No pertinent surgical history.   Medications    Current Discharge Medication List     CONTINUE these medications which have NOT CHANGED   Details  ferrous sulfate 324 MG TBEC Take 324 mg by mouth.    Prenatal Vit-Fe Fumarate-FA (MULTIVITAMIN-PRENATAL) 27-0.8 MG TABS tablet Take 1 tablet by mouth daily at 12 noon.          Allergies  Patient has no known allergies.  Review of Systems  Constitutional: negative Eyes: negative Ears, nose, mouth, throat, and face: negative Respiratory: negative Cardiovascular: negative Gastrointestinal: negative Genitourinary:negative Integument/breast: negative Hematologic/lymphatic: negative Musculoskeletal:negative Neurological: negative Behavioral/Psych: negative Endocrine: negative Allergic/Immunologic: negative  Physical Exam  BP 123/71 (BP Location: Left Arm)   Pulse 88   Temp 98.5 F (36.9 C) (Oral)   Resp 18   Ht 5\' 4"  (1.626 m)   Wt 79.9 kg   LMP 01/14/2022 (Approximate) Comment: spotting before and after last period  BMI 30.24 kg/m   Lungs:  CTA B Cardio: RRR without M/R/G Abd: Soft, gravid, NT Presentation: cephalic EXT: No C/C/ 1+ Edema DTRs: 2+ B CERVIX: Dilation: 4 Effacement (%): 70 Station: -2 Presentation: Vertex  See Prenatal records for more detailed PE.     FHR:  Baseline: 130 bpm, Variability: Good {> 6 bpm), Accelerations: Reactive, and Decelerations: Absent  Toco: Uterine Contractions: Frequency: Every 3-4 minutes and Intensity: moderate  Test Results  No results found for this or any previous visit (from the past 24 hour(s)). Group B Strep positive  Assessment   G4P3003 at [redacted]w[redacted]d Estimated Date of Delivery: 10/21/22  The fetus is reassuring.   Patient Active Problem List   Diagnosis  Date Noted   Positive GBS test 09/28/2022   Susceptible to varicella (non-immune), currently pregnant 05/11/2022   Late prenatal care 15 5/7 05/04/2022   Supervision of other normal pregnancy, antepartum 05/04/2022   history macrosomic infant 9#1 01/2019 05/04/2022   Anemia affecting pregnancy in second trimester 05/04/2022   History gestational hypertension 01/2019 with IOL 03/02/2019    Plan  1. Admit to L&D :   2. EFM:-- Category 1 3. IV pain meds  or Epidural if desired.   4. Admission labs  5. Anticipate  NSVD 6. Dr. Valentino Saxon notified of admission  Doreene Burke, [place]  10/26/2022 7:15 AM

## 2022-10-26 NOTE — Lactation Note (Signed)
This note was copied from a baby's chart. Lactation Consultation Note  Patient Name: Jenna Marks [id number] Date: 10/26/2022 Age:25 hours Reason for consult: Initial assessment;Mother's request;Term;Breastfeeding assistance;RN request   Maternal Data This is mom's 4th baby, SVD. Mom with a history of anemia and pregnancy induced hypertension. Mom is an experienced breastfeeding mother.  On initial visit mom reports baby has latched and breastfed X2 however baby is now sleepy and is not latching. Has patient been taught Hand Expression?: Yes Does the patient have breastfeeding experience prior to this delivery?: Yes How long did the patient breastfeed?: Per mom she breastfed her last baby for more than a year until she became pregnant with this baby. She breastfed her 2nd child as long. Her first child she breastfed a few weeks.  Feeding Mother's Current Feeding Choice: Breast Milk Assisted mom with waking sleepy baby. Unswaddled baby and assisted mom with positioning. Mom had tubal ligation today and is also feeling sleepy.Baby did latch and feed with a few audible swallows noted. Provided tips and strategies to maximize position and latch technique.Mom was able to correct baby's lower lip latch.  LATCH Score Latch: Repeated attempts needed to sustain latch, nipple held in mouth throughout feeding, stimulation needed to elicit sucking reflex. (Baby tends to pull lower lip inward when latching. Instructed mom on how to correct lower lip latch.)  Audible Swallowing: A few with stimulation  Type of Nipple: Everted at rest and after stimulation  Comfort (Breast/Nipple): Soft / non-tender  Hold (Positioning): Assistance needed to correctly position infant at breast and maintain latch.  LATCH Score: 7    Interventions Interventions: Breast feeding basics reviewed;Assisted with latch;Breast massage;Breast compression;Adjust position;Support pillows;Education  Discharge Pump:  Personal  Consult Status Consult Status: Follow-up  Update provided to care nurse.  Fuller Song 10/26/2022, 3:12 PM

## 2022-10-26 NOTE — Anesthesia Preprocedure Evaluation (Signed)
Anesthesia Evaluation  Patient identified by MRN, date of birth, ID band Patient awake    Reviewed: Allergy & Precautions, H&P , NPO status , Patient's Chart, lab work & pertinent test results, reviewed documented beta blocker date and time   History of Anesthesia Complications Negative for: history of anesthetic complications  Airway Mallampati: II  TM Distance: >3 FB Neck ROM: full    Dental  (+) Dental Advidsory Given, Teeth Intact   Pulmonary neg pulmonary ROS, Continuous Positive Airway Pressure Ventilation    Pulmonary exam normal breath sounds clear to auscultation       Cardiovascular Exercise Tolerance: Good negative cardio ROS Normal cardiovascular exam Rhythm:regular Rate:Normal     Neuro/Psych negative neurological ROS  negative psych ROS   GI/Hepatic negative GI ROS, Neg liver ROS,,,  Endo/Other  negative endocrine ROS    Renal/GU negative Renal ROS  negative genitourinary   Musculoskeletal   Abdominal   Peds  Hematology  (+) Blood dyscrasia, anemia   Anesthesia Other Findings Past Medical History: 05/04/2022: Anemia affecting pregnancy in second trimester No date: Pregnancy induced hypertension   Reproductive/Obstetrics (+) Breast feeding                              Anesthesia Physical Anesthesia Plan  ASA: 1  Anesthesia Plan: General   Post-op Pain Management:    Induction: Intravenous, Rapid sequence and Cricoid pressure planned  PONV Risk Score and Plan: 3 and Ondansetron, Dexamethasone and Treatment may vary due to age or medical condition  Airway Management Planned: Oral ETT  Additional Equipment:   Intra-op Plan:   Post-operative Plan: Extubation in OR  Informed Consent: I have reviewed the patients History and Physical, chart, labs and discussed the procedure including the risks, benefits and alternatives for the proposed anesthesia with the patient or  authorized representative who has indicated his/her understanding and acceptance.     Dental Advisory Given  Plan Discussed with: Anesthesiologist, CRNA and Surgeon  Anesthesia Plan Comments:        Anesthesia Quick Evaluation

## 2022-10-26 NOTE — OB Triage Note (Signed)
Jenna Marks 27 y.o. G4P3 [redacted]w[redacted]d presents to Labor & Delivery triage via wheelchair steered by ED staff reporting contractions. Desires epidural. Plans to breastfeed. She denies signs and symptoms consistent with rupture of membranes or active vaginal bleeding. She denies contractions and states positive fetal movement. External FM and TOCO applied to non-tender abdomen. Initial FHR 140. Vital signs obtained and within normal limits. Patient oriented to care environment including call bell and bed control use. Doreene Burke, CNM notified of patient's arrival. Will do 2 hour labor eval.

## 2022-10-27 ENCOUNTER — Other Ambulatory Visit: Payer: Self-pay

## 2022-10-27 ENCOUNTER — Encounter: Payer: Self-pay | Admitting: Obstetrics and Gynecology

## 2022-10-27 DIAGNOSIS — D62 Acute posthemorrhagic anemia: Secondary | ICD-10-CM | POA: Diagnosis not present

## 2022-10-27 LAB — CBC
HCT: 27.6 % — ABNORMAL LOW (ref 36.0–46.0)
Hemoglobin: 9.2 g/dL — ABNORMAL LOW (ref 12.0–15.0)
MCH: 28.3 pg (ref 26.0–34.0)
MCHC: 33.3 g/dL (ref 30.0–36.0)
MCV: 84.9 fL (ref 80.0–100.0)
Platelets: 233 10*3/uL (ref 150–400)
RBC: 3.25 MIL/uL — ABNORMAL LOW (ref 3.87–5.11)
RDW: 15.2 % (ref 11.5–15.5)
WBC: 12.8 10*3/uL — ABNORMAL HIGH (ref 4.0–10.5)
nRBC: 0 % (ref 0.0–0.2)

## 2022-10-27 MED ORDER — DIBUCAINE (PERIANAL) 1 % EX OINT: 1.0000 | TOPICAL_OINTMENT | CUTANEOUS | Status: AC | PRN

## 2022-10-27 MED ORDER — SIMETHICONE 80 MG PO CHEW: 80.0000 mg | CHEWABLE_TABLET | ORAL | Status: AC | PRN

## 2022-10-27 MED ORDER — IBUPROFEN 600 MG PO TABS
600.0000 mg | ORAL_TABLET | Freq: Four times a day (QID) | ORAL | 0 refills | Status: AC
  Filled 2022-10-27: qty 30, 8d supply, fill #0

## 2022-10-27 MED ORDER — BENZOCAINE-MENTHOL 20-0.5 % EX AERO: 1.0000 | INHALATION_SPRAY | CUTANEOUS | Status: AC | PRN

## 2022-10-27 MED ORDER — VARICELLA VIRUS VACCINE LIVE 1350 PFU/0.5ML IJ SUSR
0.5000 mL | Freq: Once | INTRAMUSCULAR | Status: DC
  Filled 2022-10-27: qty 0.5

## 2022-10-27 MED ORDER — DOCUSATE SODIUM 100 MG PO CAPS
100.0000 mg | ORAL_CAPSULE | Freq: Two times a day (BID) | ORAL | 1 refills | Status: AC | PRN
  Filled 2022-10-27: qty 30, 15d supply, fill #0

## 2022-10-27 MED ORDER — ACETAMINOPHEN 325 MG PO TABS: 1000.0000 mg | ORAL_TABLET | Freq: Three times a day (TID) | ORAL | Status: AC | PRN

## 2022-10-27 MED ORDER — WITCH HAZEL-GLYCERIN EX PADS
1.0000 | MEDICATED_PAD | CUTANEOUS | 12 refills | Status: AC | PRN
  Filled 2022-10-27: qty 40, fill #0

## 2022-10-27 NOTE — Discharge Instructions (Signed)

## 2022-10-27 NOTE — Progress Notes (Signed)
Patient discharged home with infant. Discharge instructions and prescriptions given and reviewed with patient. Patient verbalized understanding. Escorted out by auxillary.  

## 2022-10-27 NOTE — Progress Notes (Signed)
Subjective:   Jenna Marks had a NSVB on 10/26/22. Her labor was uncomplicated. Has had routine postpartum care.  Pt. Is eating, hydrating, and voiding regularly without difficulty. Has yet to have BM. She is breastfeeding. Reports mild/moderate vaginal bleeding, denies passing large blood clots. Has had cramping abdomen pain relieved with tylenol/ibuprofen. Postpartum tubal ligation completed yesterday. Denies anxiety/depression symptoms. Endorses good support from partner and family. She reports a history of postpartum depression symptoms, never treated. Open to postpartum home visit and counseling through health department.  Objective:  Vital signs in last 24 hours: Temp:  [96.8 F (36 C)-99.6 F (37.6 C)] 98 F (36.7 C) (08/27 0747) Pulse Rate:  [68-98] 88 (08/27 0747) Resp:  [14-24] 16 (08/27 0747) BP: (109-123)/(61-79) 120/68 (08/27 0747) SpO2:  [98 %-100 %] 99 % (08/27 0747)    General: NAD Pulmonary: no increased work of breathing Breasts: soft, non-tender, nipples without breakdown Abdomen: soft, non-tender Fundus: firm, midline, at umbilicus Lochia: light rubra, no clots Perineum: no erythema or foul odor discharge, minimal edema, laceration well approximated  Extremities: no edema, no erythema, no tenderness  Results for orders placed or performed during the hospital encounter of 10/26/22 (from the past 72 hour(s))  CBC     Status: Abnormal   Collection Time: 10/26/22  7:25 AM  Result Value Ref Range   WBC 13.0 (H) 4.0 - 10.5 K/uL   RBC 4.25 3.87 - 5.11 MIL/uL   Hemoglobin 12.0 12.0 - 15.0 g/dL   HCT 53.6 64.4 - 03.4 %   MCV 85.4 80.0 - 100.0 fL   MCH 28.2 26.0 - 34.0 pg   MCHC 33.1 30.0 - 36.0 g/dL   RDW 74.2 59.5 - 63.8 %   Platelets 231 150 - 400 K/uL   nRBC 0.0 0.0 - 0.2 %    Comment: Performed at [facility], [address] [place], [place] [address]  RPR     Status: None   Collection Time: 10/26/22  7:25 AM  Result Value Ref Range    RPR Ser Ql NON REACTIVE NON REACTIVE    Comment: Performed at [facility], [address] [place], [place] [address]  Type and screen     Status: None   Collection Time: 10/26/22  7:26 AM  Result Value Ref Range   ABO/RH(D) O POS    Antibody Screen NEG    Sample Expiration      10/29/2022,2359 Performed at [facility], [address] [place], [place] [address]   CBC     Status: Abnormal   Collection Time: 10/27/22  5:30 AM  Result Value Ref Range   WBC 12.8 (H) 4.0 - 10.5 K/uL   RBC 3.25 (L) 3.87 - 5.11 MIL/uL   Hemoglobin 9.2 (L) 12.0 - 15.0 g/dL   HCT 60.6 (L) 30.1 - 60.1 %   MCV 84.9 80.0 - 100.0 fL   MCH 28.3 26.0 - 34.0 pg   MCHC 33.3 30.0 - 36.0 g/dL   RDW 09.3 23.5 - 57.3 %   Platelets 233 150 - 400 K/uL   nRBC 0.0 0.0 - 0.2 %    Comment: Performed at [facility], [address], [place], [place] [address]    Assessment:   27 y.o. [phone] 1 day(s)  s/p NSVB Breastfeeding Anemia secondary to acute blood loss- hemodynamically stable and asymptomatic VSS Pain well controlled  Plan:    PO Fe, continue Blood Type --/--/O POS (08/26 0726) / Rubella 1.17 (  03/04 1549) / Varicella Non-Immune, varivax ordered Rhogam not indicated Tdap/varicella/rubella to be offered before discharge if indicated Feeding plan breast, lactation support prn Tubal ligation completed prior to discharge. Continued routine postpartum care  Counseled on normal uterine involution and vaginal bleeding postpartum Postpartum home visit & possible counseling through health department for mood support.    Dominica Severin, CNM [facility] 10/27/2022, 11:28 AM

## 2022-10-27 NOTE — Lactation Note (Signed)
This note was copied from a baby's chart. Lactation Consultation Note  Patient Name: Jenna Marks [id number] Date: 10/27/2022 Age:25 hours Reason for consult: Follow-up assessment;Term   Maternal Data This is mom's 4th baby, SVD. Mom with a history of anemia and pregnancy induced hypertension. Mom is an experienced breastfeeding mother.   On follow-up today mom reports baby latched well all night. Mom reports baby was sleepier in the early am feeds after cluster feeding so she decided to post pump. Mom pumped 15 ml's. Baby appeared to be showing some subtle feeding cues when LC entered the room. Recommended mom attempt to latch baby. Baby briefly latched with bottom lip now rolled outward(yesterday baby had bottom lip rolled inward). Baby with a few sucks , 1 swallow and detached baby asleep and did not continue. Per mom baby completed a feed 1 hour ago. Baby with 8 breastfeeding sessions, 2 wet diapers and 3 stool diapers in past 24 hours.  Has patient been taught Hand Expression?: Yes Does the patient have breastfeeding experience prior to this delivery?: Yes How long did the patient breastfeed?: Per mom she breastfed her last baby for more than a year until she became pregnant with this baby. She breastfed her 2nd child as long. Her first child she breastfed a few weeks.  Feeding Mother's Current Feeding Choice: Breast Milk   Interventions Interventions: Education  Discharge Discharge Education: Engorgement and breast care;Warning signs for feeding baby;Outpatient recommendation Pump: Personal  Consult Status Consult Status: Complete  Update provided to care nurse.  Fuller Song 10/27/2022, 11:45 AM

## 2022-10-28 ENCOUNTER — Ambulatory Visit: Payer: Medicaid Other

## 2022-10-28 NOTE — Anesthesia Postprocedure Evaluation (Signed)
Anesthesia Post Note  Patient: Jenna Marks  Procedure(s) Performed: POST PARTUM TUBAL LIGATION (Bilateral)  Patient location during evaluation: PACU Anesthesia Type: General Level of consciousness: awake and alert Pain management: pain level controlled Vital Signs Assessment: post-procedure vital signs reviewed and stable Respiratory status: spontaneous breathing, nonlabored ventilation, respiratory function stable and patient connected to nasal cannula oxygen Cardiovascular status: blood pressure returned to baseline and stable Postop Assessment: no apparent nausea or vomiting Anesthetic complications: no   No notable events documented.   Last Vitals:  Vitals:   10/27/22 1132 10/27/22 1600  BP: 124/67 121/76  Pulse: 90 83  Resp: 18 16  Temp: 37.2 C 36.9 C  SpO2:  99%    Last Pain:  Vitals:   10/27/22 1600  TempSrc: Oral  PainSc:                  Lenard Simmer

## 2022-11-05 ENCOUNTER — Telehealth: Payer: Self-pay

## 2022-11-05 NOTE — Telephone Encounter (Signed)
[facility]- Discharge Call Backs-Left Voicemail about the following below. 1-Do you have any questions or concerns about yourself as you heal? 2-Any concerns or questions about your baby? Is your baby eating, peeing,pooping well? 3 Reviewed ABC's of safe sleep. 4-How was your stay at the hospital? 5- Did our team work together to care for you? You should be receiving a survey in the mail soon.   We would really appreciate it if you could fill that out for us and return it in the mail.  We value the feedback to make improvements and continue the great work we do.   If you have any questions please feel free to call me back at [phone]  

## 2022-11-13 ENCOUNTER — Encounter: Payer: Self-pay | Admitting: Advanced Practice Midwife

## 2022-11-13 DIAGNOSIS — Z9851 Tubal ligation status: Secondary | ICD-10-CM | POA: Insufficient documentation

## 2022-11-13 NOTE — Progress Notes (Unsigned)
Patient requested provider complete Return To Work Form and fax to number on form once completed. Faxed completed form to [phone]. BTHIELE RN

## 2022-11-17 ENCOUNTER — Telehealth: Payer: Self-pay | Admitting: General Practice

## 2022-11-17 NOTE — Telephone Encounter (Signed)
Pt wants a call back to better explain her leave of absence note for work, needs fixing on provider end.

## 2022-12-07 ENCOUNTER — Ambulatory Visit: Payer: Medicaid Other | Admitting: Advanced Practice Midwife

## 2022-12-07 DIAGNOSIS — Z5321 Procedure and treatment not carried out due to patient leaving prior to being seen by health care provider: Secondary | ICD-10-CM

## 2022-12-07 NOTE — Progress Notes (Addendum)
Patient left without being seen. 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 Was just about to call client back.Marland KitchenMarland KitchenBurt Knack, RN

## 2022-12-08 ENCOUNTER — Telehealth: Payer: Self-pay

## 2022-12-08 NOTE — Telephone Encounter (Signed)
Patient called to reschedule postpartum appointment and requested to speak to nurse regarding "right breast lump".  Patient denies pain, redness of right breast and fevers. Scheduled appointment for 12/14/2022. Recommendations given to patient over the phone by E. Sciora including warm compresses to right breast prior to feeding, feed from right breast 1st and breast feed baby (or pump) every 2-3 hours. Patient told to call us if fever, redness of breast because will need to be seen (or go to ER). Patient verbalized understanding of instructions. BTHIELE RN

## 2022-12-22 ENCOUNTER — Ambulatory Visit: Payer: Medicaid Other
